# Patient Record
Sex: Male | Born: 1985 | Race: White | Hispanic: No | Marital: Single | Smoking: Never smoker
Health system: Southern US, Community
[De-identification: ages and names within clinical notes are randomized; demographics above are authoritative.]

## PROBLEM LIST (undated history)

## (undated) DIAGNOSIS — K219 Gastro-esophageal reflux disease without esophagitis: Secondary | ICD-10-CM

## (undated) DIAGNOSIS — E785 Hyperlipidemia, unspecified: Secondary | ICD-10-CM

## (undated) DIAGNOSIS — T7840XA Allergy, unspecified, initial encounter: Secondary | ICD-10-CM

## (undated) HISTORY — DX: Hyperlipidemia, unspecified: E78.5

## (undated) HISTORY — DX: Allergy, unspecified, initial encounter: T78.40XA

## (undated) HISTORY — DX: Gastro-esophageal reflux disease without esophagitis: K21.9

## (undated) HISTORY — PX: TYMPANOSTOMY TUBE PLACEMENT: SHX32

---

## 2007-09-28 ENCOUNTER — Emergency Department: Payer: Self-pay | Admitting: Internal Medicine

## 2019-06-09 DIAGNOSIS — M7540 Impingement syndrome of unspecified shoulder: Secondary | ICD-10-CM | POA: Insufficient documentation

## 2019-06-09 DIAGNOSIS — M25811 Other specified joint disorders, right shoulder: Secondary | ICD-10-CM | POA: Insufficient documentation

## 2020-12-25 DIAGNOSIS — M9903 Segmental and somatic dysfunction of lumbar region: Secondary | ICD-10-CM | POA: Diagnosis not present

## 2020-12-25 DIAGNOSIS — M62830 Muscle spasm of back: Secondary | ICD-10-CM | POA: Diagnosis not present

## 2020-12-25 DIAGNOSIS — M9905 Segmental and somatic dysfunction of pelvic region: Secondary | ICD-10-CM | POA: Diagnosis not present

## 2020-12-25 DIAGNOSIS — M955 Acquired deformity of pelvis: Secondary | ICD-10-CM | POA: Diagnosis not present

## 2020-12-27 DIAGNOSIS — M62830 Muscle spasm of back: Secondary | ICD-10-CM | POA: Diagnosis not present

## 2020-12-27 DIAGNOSIS — M955 Acquired deformity of pelvis: Secondary | ICD-10-CM | POA: Diagnosis not present

## 2020-12-27 DIAGNOSIS — M9905 Segmental and somatic dysfunction of pelvic region: Secondary | ICD-10-CM | POA: Diagnosis not present

## 2020-12-27 DIAGNOSIS — M9903 Segmental and somatic dysfunction of lumbar region: Secondary | ICD-10-CM | POA: Diagnosis not present

## 2021-01-09 DIAGNOSIS — M9905 Segmental and somatic dysfunction of pelvic region: Secondary | ICD-10-CM | POA: Diagnosis not present

## 2021-01-09 DIAGNOSIS — M9903 Segmental and somatic dysfunction of lumbar region: Secondary | ICD-10-CM | POA: Diagnosis not present

## 2021-01-09 DIAGNOSIS — M955 Acquired deformity of pelvis: Secondary | ICD-10-CM | POA: Diagnosis not present

## 2021-01-09 DIAGNOSIS — M62830 Muscle spasm of back: Secondary | ICD-10-CM | POA: Diagnosis not present

## 2021-04-24 DIAGNOSIS — M9905 Segmental and somatic dysfunction of pelvic region: Secondary | ICD-10-CM | POA: Diagnosis not present

## 2021-04-24 DIAGNOSIS — M9903 Segmental and somatic dysfunction of lumbar region: Secondary | ICD-10-CM | POA: Diagnosis not present

## 2021-04-24 DIAGNOSIS — M62830 Muscle spasm of back: Secondary | ICD-10-CM | POA: Diagnosis not present

## 2021-04-24 DIAGNOSIS — M955 Acquired deformity of pelvis: Secondary | ICD-10-CM | POA: Diagnosis not present

## 2021-04-30 DIAGNOSIS — M9905 Segmental and somatic dysfunction of pelvic region: Secondary | ICD-10-CM | POA: Diagnosis not present

## 2021-04-30 DIAGNOSIS — M955 Acquired deformity of pelvis: Secondary | ICD-10-CM | POA: Diagnosis not present

## 2021-04-30 DIAGNOSIS — M9903 Segmental and somatic dysfunction of lumbar region: Secondary | ICD-10-CM | POA: Diagnosis not present

## 2021-04-30 DIAGNOSIS — M62830 Muscle spasm of back: Secondary | ICD-10-CM | POA: Diagnosis not present

## 2021-05-07 DIAGNOSIS — M955 Acquired deformity of pelvis: Secondary | ICD-10-CM | POA: Diagnosis not present

## 2021-05-07 DIAGNOSIS — M9903 Segmental and somatic dysfunction of lumbar region: Secondary | ICD-10-CM | POA: Diagnosis not present

## 2021-05-07 DIAGNOSIS — M9905 Segmental and somatic dysfunction of pelvic region: Secondary | ICD-10-CM | POA: Diagnosis not present

## 2021-05-07 DIAGNOSIS — M62830 Muscle spasm of back: Secondary | ICD-10-CM | POA: Diagnosis not present

## 2021-05-16 DIAGNOSIS — M955 Acquired deformity of pelvis: Secondary | ICD-10-CM | POA: Diagnosis not present

## 2021-05-16 DIAGNOSIS — M62830 Muscle spasm of back: Secondary | ICD-10-CM | POA: Diagnosis not present

## 2021-05-16 DIAGNOSIS — M9903 Segmental and somatic dysfunction of lumbar region: Secondary | ICD-10-CM | POA: Diagnosis not present

## 2021-05-16 DIAGNOSIS — M9905 Segmental and somatic dysfunction of pelvic region: Secondary | ICD-10-CM | POA: Diagnosis not present

## 2021-05-21 DIAGNOSIS — M9905 Segmental and somatic dysfunction of pelvic region: Secondary | ICD-10-CM | POA: Diagnosis not present

## 2021-05-21 DIAGNOSIS — M955 Acquired deformity of pelvis: Secondary | ICD-10-CM | POA: Diagnosis not present

## 2021-05-21 DIAGNOSIS — M62830 Muscle spasm of back: Secondary | ICD-10-CM | POA: Diagnosis not present

## 2021-05-21 DIAGNOSIS — M9903 Segmental and somatic dysfunction of lumbar region: Secondary | ICD-10-CM | POA: Diagnosis not present

## 2021-05-22 DIAGNOSIS — M955 Acquired deformity of pelvis: Secondary | ICD-10-CM | POA: Diagnosis not present

## 2021-05-22 DIAGNOSIS — M9905 Segmental and somatic dysfunction of pelvic region: Secondary | ICD-10-CM | POA: Diagnosis not present

## 2021-05-22 DIAGNOSIS — M9903 Segmental and somatic dysfunction of lumbar region: Secondary | ICD-10-CM | POA: Diagnosis not present

## 2021-05-22 DIAGNOSIS — M62830 Muscle spasm of back: Secondary | ICD-10-CM | POA: Diagnosis not present

## 2021-05-23 DIAGNOSIS — M62830 Muscle spasm of back: Secondary | ICD-10-CM | POA: Diagnosis not present

## 2021-05-23 DIAGNOSIS — M9905 Segmental and somatic dysfunction of pelvic region: Secondary | ICD-10-CM | POA: Diagnosis not present

## 2021-05-23 DIAGNOSIS — M955 Acquired deformity of pelvis: Secondary | ICD-10-CM | POA: Diagnosis not present

## 2021-05-23 DIAGNOSIS — M9903 Segmental and somatic dysfunction of lumbar region: Secondary | ICD-10-CM | POA: Diagnosis not present

## 2021-05-27 DIAGNOSIS — M62830 Muscle spasm of back: Secondary | ICD-10-CM | POA: Diagnosis not present

## 2021-05-27 DIAGNOSIS — M9903 Segmental and somatic dysfunction of lumbar region: Secondary | ICD-10-CM | POA: Diagnosis not present

## 2021-05-27 DIAGNOSIS — M955 Acquired deformity of pelvis: Secondary | ICD-10-CM | POA: Diagnosis not present

## 2021-05-27 DIAGNOSIS — M9905 Segmental and somatic dysfunction of pelvic region: Secondary | ICD-10-CM | POA: Diagnosis not present

## 2021-09-06 DIAGNOSIS — M62830 Muscle spasm of back: Secondary | ICD-10-CM | POA: Diagnosis not present

## 2021-09-06 DIAGNOSIS — M9903 Segmental and somatic dysfunction of lumbar region: Secondary | ICD-10-CM | POA: Diagnosis not present

## 2021-09-06 DIAGNOSIS — M9905 Segmental and somatic dysfunction of pelvic region: Secondary | ICD-10-CM | POA: Diagnosis not present

## 2021-09-06 DIAGNOSIS — M955 Acquired deformity of pelvis: Secondary | ICD-10-CM | POA: Diagnosis not present

## 2021-09-13 DIAGNOSIS — M62830 Muscle spasm of back: Secondary | ICD-10-CM | POA: Diagnosis not present

## 2021-09-13 DIAGNOSIS — M955 Acquired deformity of pelvis: Secondary | ICD-10-CM | POA: Diagnosis not present

## 2021-09-13 DIAGNOSIS — M9903 Segmental and somatic dysfunction of lumbar region: Secondary | ICD-10-CM | POA: Diagnosis not present

## 2021-09-13 DIAGNOSIS — M9905 Segmental and somatic dysfunction of pelvic region: Secondary | ICD-10-CM | POA: Diagnosis not present

## 2021-09-26 DIAGNOSIS — M9905 Segmental and somatic dysfunction of pelvic region: Secondary | ICD-10-CM | POA: Diagnosis not present

## 2021-09-26 DIAGNOSIS — M9903 Segmental and somatic dysfunction of lumbar region: Secondary | ICD-10-CM | POA: Diagnosis not present

## 2021-09-26 DIAGNOSIS — M62830 Muscle spasm of back: Secondary | ICD-10-CM | POA: Diagnosis not present

## 2021-09-26 DIAGNOSIS — M955 Acquired deformity of pelvis: Secondary | ICD-10-CM | POA: Diagnosis not present

## 2021-10-31 DIAGNOSIS — M9903 Segmental and somatic dysfunction of lumbar region: Secondary | ICD-10-CM | POA: Diagnosis not present

## 2021-10-31 DIAGNOSIS — M955 Acquired deformity of pelvis: Secondary | ICD-10-CM | POA: Diagnosis not present

## 2021-10-31 DIAGNOSIS — M62830 Muscle spasm of back: Secondary | ICD-10-CM | POA: Diagnosis not present

## 2021-10-31 DIAGNOSIS — M9905 Segmental and somatic dysfunction of pelvic region: Secondary | ICD-10-CM | POA: Diagnosis not present

## 2022-01-10 DIAGNOSIS — M62830 Muscle spasm of back: Secondary | ICD-10-CM | POA: Diagnosis not present

## 2022-01-10 DIAGNOSIS — M9905 Segmental and somatic dysfunction of pelvic region: Secondary | ICD-10-CM | POA: Diagnosis not present

## 2022-01-10 DIAGNOSIS — M9903 Segmental and somatic dysfunction of lumbar region: Secondary | ICD-10-CM | POA: Diagnosis not present

## 2022-01-10 DIAGNOSIS — M955 Acquired deformity of pelvis: Secondary | ICD-10-CM | POA: Diagnosis not present

## 2022-02-11 DIAGNOSIS — M955 Acquired deformity of pelvis: Secondary | ICD-10-CM | POA: Diagnosis not present

## 2022-02-11 DIAGNOSIS — M9905 Segmental and somatic dysfunction of pelvic region: Secondary | ICD-10-CM | POA: Diagnosis not present

## 2022-02-11 DIAGNOSIS — M62830 Muscle spasm of back: Secondary | ICD-10-CM | POA: Diagnosis not present

## 2022-02-11 DIAGNOSIS — M9903 Segmental and somatic dysfunction of lumbar region: Secondary | ICD-10-CM | POA: Diagnosis not present

## 2022-02-12 DIAGNOSIS — M62830 Muscle spasm of back: Secondary | ICD-10-CM | POA: Diagnosis not present

## 2022-02-12 DIAGNOSIS — M9903 Segmental and somatic dysfunction of lumbar region: Secondary | ICD-10-CM | POA: Diagnosis not present

## 2022-02-12 DIAGNOSIS — M9905 Segmental and somatic dysfunction of pelvic region: Secondary | ICD-10-CM | POA: Diagnosis not present

## 2022-02-12 DIAGNOSIS — M955 Acquired deformity of pelvis: Secondary | ICD-10-CM | POA: Diagnosis not present

## 2022-02-13 ENCOUNTER — Encounter: Payer: Self-pay | Admitting: Family Medicine

## 2022-02-13 ENCOUNTER — Ambulatory Visit (INDEPENDENT_AMBULATORY_CARE_PROVIDER_SITE_OTHER): Payer: BC Managed Care – PPO | Admitting: Family Medicine

## 2022-02-13 ENCOUNTER — Ambulatory Visit
Admission: RE | Admit: 2022-02-13 | Discharge: 2022-02-13 | Disposition: A | Discharge status: Home / Self Care | Payer: BC Managed Care – PPO | Source: Ambulatory Visit | Attending: Family Medicine | Admitting: Family Medicine

## 2022-02-13 ENCOUNTER — Other Ambulatory Visit: Payer: Self-pay

## 2022-02-13 ENCOUNTER — Ambulatory Visit
Admission: RE | Admit: 2022-02-13 | Discharge: 2022-02-13 | Disposition: A | Discharge status: Home / Self Care | Payer: BC Managed Care – PPO | Attending: Family Medicine | Admitting: Family Medicine

## 2022-02-13 VITALS — BP 124/92 | HR 78 | Ht 66.0 in | Wt 228.0 lb

## 2022-02-13 DIAGNOSIS — M222X1 Patellofemoral disorders, right knee: Secondary | ICD-10-CM

## 2022-02-13 DIAGNOSIS — G8929 Other chronic pain: Secondary | ICD-10-CM | POA: Insufficient documentation

## 2022-02-13 DIAGNOSIS — M545 Low back pain, unspecified: Secondary | ICD-10-CM

## 2022-02-13 DIAGNOSIS — M533 Sacrococcygeal disorders, not elsewhere classified: Secondary | ICD-10-CM | POA: Diagnosis not present

## 2022-02-13 DIAGNOSIS — M222X2 Patellofemoral disorders, left knee: Secondary | ICD-10-CM

## 2022-02-13 DIAGNOSIS — M25562 Pain in left knee: Secondary | ICD-10-CM | POA: Diagnosis not present

## 2022-02-13 DIAGNOSIS — M25561 Pain in right knee: Secondary | ICD-10-CM | POA: Diagnosis not present

## 2022-02-13 DIAGNOSIS — Z23 Encounter for immunization: Secondary | ICD-10-CM | POA: Diagnosis not present

## 2022-02-13 DIAGNOSIS — M4727 Other spondylosis with radiculopathy, lumbosacral region: Secondary | ICD-10-CM | POA: Insufficient documentation

## 2022-02-13 MED ORDER — MELOXICAM 15 MG PO TABS: 15.0000 mg | ORAL_TABLET | Freq: Every day | ORAL | 0 refills | Status: DC

## 2022-02-13 NOTE — Patient Instructions (Signed)
-   Obtain x-rays - Start meloxicam, dose daily with food x2 weeks, after 2 weeks dose once daily on as-needed basis for any low back/knee pain - Can look into "patellofemoral syndrome "and "sacroiliitis/lumbar spondylosis " - Return for follow-up in 4 weeks

## 2022-02-13 NOTE — Progress Notes (Signed)
°  ° °  Primary Care / Sports Medicine Office Visit  Patient Information:  Patient ID: Sean Freeman, male DOB: 1986/10/19 Age: 36 y.o. MRN: [id number]   Sean Freeman is a pleasant 36 y.o. male presenting with the following:  Chief Complaint  Patient presents with   New Patient (Initial Visit)   Establish Care   Back Pain   Knee Pain    Vitals:   02/13/22 1046  BP: (!) 124/92  Pulse: 78  SpO2: 99%   Vitals:   02/13/22 1046  Weight: 228 lb (103.4 kg)  Height: 5\' 6"  (1.676 m)   Body mass index is 36.8 kg/m.  No results found.   Independent interpretation of notes and tests performed by another provider:   None  Procedures performed:   None  Pertinent History, Exam, Impression, and Recommendations:   Patellofemoral syndrome, bilateral Longstanding bilateral anterior knee pain, ongoing for roughly 5 years, atraumatic in onset.  Associated with clicking and popping, denies any mechanical symptoms, no instability, pain with deep bend/squat, difficulty arising from this position.  Denies any treatments to date.  Examination reveals dynamic maltracking with passive flexion/extension, tenderness at the superior and inferior left patella, mild tenderness of the right inferior patella, bilaterally tenderness at the medial joint lines, no laxity with valgus/varus or anterior/posterior stressing, negative medial and lateral McMurray's, single-leg squat with excessive valgus.  History and physical most consistent with patellofemoral syndrome bilaterally, treatment at this stage involving scheduled meloxicam x2 weeks then as needed, dedicated x-rays, and close follow-up.  Once symptoms allow, formal PT versus home-based PT to commence.  Chronic condition, Rx management  Chronic low back pain without sciatica Patient with longstanding (greater than 10 years) low back pain without radiation in the setting of multiple sports/athletics and varying injuries.  He denies any weakness or  paresthesias, intermittently left greater than right and vice versa pain localized about the sacrum, aggravated by bending, twisting, prolonged weightbearing.  This is episodic in nature and most recent episode was earlier this week.  He has been utilizing OTC ibuprofen as needed and chiropractic care.  Denies any bowel/bladder issues.  Physical exam reveals negative straight leg raise bilaterally, sensorimotor intact bilateral lower extremities, hamstring tightness bilaterally but otherwise negative FABER, FADIR, equivocal piriformis stretching bilaterally, focal tenderness at the left greater than right SI joint.  Findings are most consistent with focal SI joint pain in the setting of chronic lumbar pathology, dedicated x-rays ordered, initiation of meloxicam, and close follow-up advised.  Chronic condition, symptomatic, Rx management   Need for tetanus, diphtheria, and acellular pertussis (Tdap) vaccine Tdap administered today  SI (sacroiliac) pain Left greater than right, chronic in nature, see additional assessment(s) for plan details.   Orders & Medications Meds ordered this encounter  Medications   meloxicam (MOBIC) 15 MG tablet    Sig: Take 1 tablet (15 mg total) by mouth daily.    Dispense:  30 tablet    Refill:  0   Orders Placed This Encounter  Procedures   DG Lumbar Spine Complete   DG Knee Complete 4 Views Right   DG Knee Complete 4 Views Left   Tdap vaccine greater than or equal to 36yo IM     Return in about 4 weeks (around 03/13/2022) for Annual physical and follow-up.     03/15/2022, MD   Primary Care Sports Medicine [facility]

## 2022-02-13 NOTE — Assessment & Plan Note (Signed)
Longstanding bilateral anterior knee pain, ongoing for roughly 5 years, atraumatic in onset.  Associated with clicking and popping, denies any mechanical symptoms, no instability, pain with deep bend/squat, difficulty arising from this position.  Denies any treatments to date.  Examination reveals dynamic maltracking with passive flexion/extension, tenderness at the superior and inferior left patella, mild tenderness of the right inferior patella, bilaterally tenderness at the medial joint lines, no laxity with valgus/varus or anterior/posterior stressing, negative medial and lateral McMurray's, single-leg squat with excessive valgus.  History and physical most consistent with patellofemoral syndrome bilaterally, treatment at this stage involving scheduled meloxicam x2 weeks then as needed, dedicated x-rays, and close follow-up.  Once symptoms allow, formal PT versus home-based PT to commence.  Chronic condition, Rx management

## 2022-02-13 NOTE — Assessment & Plan Note (Signed)
Tdap administered today. 

## 2022-02-13 NOTE — Progress Notes (Signed)
°  ° °  Primary Care / Sports Medicine Office Visit  Patient Information:  Patient ID: Sean Freeman, male DOB: [birth year] Age: 36 y.o. MRN: [id number]   Sean Freeman is a pleasant 36 y.o. male presenting with the following:  Chief Complaint  Patient presents with   New Patient (Initial Visit)   Establish Care   Back Pain   Knee Pain    Vitals:   02/13/22 1046  BP: (!) 124/92  Pulse: 78  SpO2: 99%   Vitals:   02/13/22 1046  Weight: 228 lb (103.4 kg)  Height: 5\' 6"  (1.676 m)   Body mass index is 36.8 kg/m.  No results found.   Independent interpretation of notes and tests performed by another provider:   None  Procedures performed:   None  Pertinent History, Exam, Impression, and Recommendations:   Patellofemoral syndrome, bilateral Longstanding bilateral anterior knee pain, ongoing for roughly 5 years, atraumatic in onset.  Associated with clicking and popping, denies any mechanical symptoms, no instability, pain with deep bend/squat, difficulty arising from this position.  Denies any treatments to date.  Examination reveals dynamic maltracking with passive flexion/extension, tenderness at the superior and inferior left patella, mild tenderness of the right inferior patella, bilaterally tenderness at the medial joint lines, no laxity with valgus/varus or anterior/posterior stressing, negative medial and lateral McMurray's, single-leg squat with excessive valgus.  History and physical most consistent with patellofemoral syndrome bilaterally, treatment at this stage involving scheduled meloxicam x2 weeks then as needed, dedicated x-rays, and close follow-up.  Once symptoms allow, formal PT versus home-based PT to commence.  Chronic condition, Rx management  Chronic low back pain without sciatica Patient with longstanding (greater than 10 years) low back pain without radiation in the setting of multiple sports/athletics and varying injuries.  He denies any weakness or  paresthesias, intermittently left greater than right and vice versa pain localized about the sacrum, aggravated by bending, twisting, prolonged weightbearing.  This is episodic in nature and most recent episode was earlier this week.  He has been utilizing OTC ibuprofen as needed and chiropractic care.  Denies any bowel/bladder issues.  Physical exam reveals negative straight leg raise bilaterally, sensorimotor intact bilateral lower extremities, hamstring tightness bilaterally but otherwise negative FABER, FADIR, equivocal piriformis stretching bilaterally, focal tenderness at the left greater than right SI joint.  Findings are most consistent with focal SI joint pain in the setting of chronic lumbar pathology, dedicated x-rays ordered, initiation of meloxicam, and close follow-up advised.  Chronic condition, symptomatic, Rx management    Orders & Medications Meds ordered this encounter  Medications   meloxicam (MOBIC) 15 MG tablet    Sig: Take 1 tablet (15 mg total) by mouth daily.    Dispense:  30 tablet    Refill:  0   Orders Placed This Encounter  Procedures   DG Lumbar Spine Complete   DG Knee Complete 4 Views Right   DG Knee Complete 4 Views Left   Tdap vaccine greater than or equal to 7yo IM     Return in about 4 weeks (around 03/13/2022) for Annual physical and follow-up.     03/15/2022, MD   Primary Care Sports Medicine [facility]

## 2022-02-13 NOTE — Assessment & Plan Note (Signed)
Patient with longstanding (greater than 10 years) low back pain without radiation in the setting of multiple sports/athletics and varying injuries.  He denies any weakness or paresthesias, intermittently left greater than right and vice versa pain localized about the sacrum, aggravated by bending, twisting, prolonged weightbearing.  This is episodic in nature and most recent episode was earlier this week.  He has been utilizing OTC ibuprofen as needed and chiropractic care.  Denies any bowel/bladder issues.  Physical exam reveals negative straight leg raise bilaterally, sensorimotor intact bilateral lower extremities, hamstring tightness bilaterally but otherwise negative FABER, FADIR, equivocal piriformis stretching bilaterally, focal tenderness at the left greater than right SI joint.  Findings are most consistent with focal SI joint pain in the setting of chronic lumbar pathology, dedicated x-rays ordered, initiation of meloxicam, and close follow-up advised.  Chronic condition, symptomatic, Rx management

## 2022-02-13 NOTE — Assessment & Plan Note (Signed)
Left greater than right, chronic in nature, see additional assessment(s) for plan details.

## 2022-02-13 NOTE — Assessment & Plan Note (Signed)
>>  ASSESSMENT AND PLAN FOR CHRONIC LOW BACK PAIN WITHOUT SCIATICA WRITTEN ON 02/13/2022  5:43 PM BY Brityn Mastrogiovanni, Ocie Bob, MD  Patient with longstanding (greater than 10 years) low back pain without radiation in the setting of multiple sports/athletics and varying injuries.  He denies any weakness or paresthesias, intermittently left greater than right and vice versa pain localized about the sacrum, aggravated by bending, twisting, prolonged weightbearing.  This is episodic in nature and most recent episode was earlier this week.  He has been utilizing OTC ibuprofen as needed and chiropractic care.  Denies any bowel/bladder issues.  Physical exam reveals negative straight leg raise bilaterally, sensorimotor intact bilateral lower extremities, hamstring tightness bilaterally but otherwise negative FABER, FADIR, equivocal piriformis stretching bilaterally, focal tenderness at the left greater than right SI joint.  Findings are most consistent with focal SI joint pain in the setting of chronic lumbar pathology, dedicated x-rays ordered, initiation of meloxicam, and close follow-up advised.  Chronic condition, symptomatic, Rx management

## 2022-02-14 DIAGNOSIS — M9905 Segmental and somatic dysfunction of pelvic region: Secondary | ICD-10-CM | POA: Diagnosis not present

## 2022-02-14 DIAGNOSIS — M9903 Segmental and somatic dysfunction of lumbar region: Secondary | ICD-10-CM | POA: Diagnosis not present

## 2022-02-14 DIAGNOSIS — M62830 Muscle spasm of back: Secondary | ICD-10-CM | POA: Diagnosis not present

## 2022-02-14 DIAGNOSIS — M955 Acquired deformity of pelvis: Secondary | ICD-10-CM | POA: Diagnosis not present

## 2022-02-28 DIAGNOSIS — M9903 Segmental and somatic dysfunction of lumbar region: Secondary | ICD-10-CM | POA: Diagnosis not present

## 2022-02-28 DIAGNOSIS — M62830 Muscle spasm of back: Secondary | ICD-10-CM | POA: Diagnosis not present

## 2022-02-28 DIAGNOSIS — M9905 Segmental and somatic dysfunction of pelvic region: Secondary | ICD-10-CM | POA: Diagnosis not present

## 2022-02-28 DIAGNOSIS — M955 Acquired deformity of pelvis: Secondary | ICD-10-CM | POA: Diagnosis not present

## 2022-03-13 ENCOUNTER — Encounter: Payer: BC Managed Care – PPO | Admitting: Family Medicine

## 2022-03-14 ENCOUNTER — Encounter: Payer: Self-pay | Admitting: Family Medicine

## 2022-03-14 ENCOUNTER — Ambulatory Visit (INDEPENDENT_AMBULATORY_CARE_PROVIDER_SITE_OTHER): Payer: BC Managed Care – PPO | Admitting: Family Medicine

## 2022-03-14 ENCOUNTER — Other Ambulatory Visit: Payer: Self-pay

## 2022-03-14 VITALS — BP 138/88 | HR 90 | Ht 66.0 in | Wt 231.0 lb

## 2022-03-14 DIAGNOSIS — M222X1 Patellofemoral disorders, right knee: Secondary | ICD-10-CM | POA: Diagnosis not present

## 2022-03-14 DIAGNOSIS — G8929 Other chronic pain: Secondary | ICD-10-CM

## 2022-03-14 DIAGNOSIS — M545 Low back pain, unspecified: Secondary | ICD-10-CM | POA: Diagnosis not present

## 2022-03-14 DIAGNOSIS — R7989 Other specified abnormal findings of blood chemistry: Secondary | ICD-10-CM | POA: Diagnosis not present

## 2022-03-14 DIAGNOSIS — M62830 Muscle spasm of back: Secondary | ICD-10-CM | POA: Diagnosis not present

## 2022-03-14 DIAGNOSIS — Z114 Encounter for screening for human immunodeficiency virus [HIV]: Secondary | ICD-10-CM | POA: Diagnosis not present

## 2022-03-14 DIAGNOSIS — K219 Gastro-esophageal reflux disease without esophagitis: Secondary | ICD-10-CM | POA: Diagnosis not present

## 2022-03-14 DIAGNOSIS — M9903 Segmental and somatic dysfunction of lumbar region: Secondary | ICD-10-CM | POA: Diagnosis not present

## 2022-03-14 DIAGNOSIS — Z13220 Encounter for screening for lipoid disorders: Secondary | ICD-10-CM

## 2022-03-14 DIAGNOSIS — Z0000 Encounter for general adult medical examination without abnormal findings: Secondary | ICD-10-CM

## 2022-03-14 DIAGNOSIS — M533 Sacrococcygeal disorders, not elsewhere classified: Secondary | ICD-10-CM | POA: Diagnosis not present

## 2022-03-14 DIAGNOSIS — M955 Acquired deformity of pelvis: Secondary | ICD-10-CM | POA: Diagnosis not present

## 2022-03-14 DIAGNOSIS — Z1159 Encounter for screening for other viral diseases: Secondary | ICD-10-CM | POA: Diagnosis not present

## 2022-03-14 DIAGNOSIS — M222X2 Patellofemoral disorders, left knee: Secondary | ICD-10-CM

## 2022-03-14 DIAGNOSIS — M9905 Segmental and somatic dysfunction of pelvic region: Secondary | ICD-10-CM | POA: Diagnosis not present

## 2022-03-14 MED ORDER — MELOXICAM 15 MG PO TABS: 15.0000 mg | ORAL_TABLET | Freq: Every day | ORAL | 1 refills | Status: DC | PRN

## 2022-03-14 MED ORDER — PANTOPRAZOLE SODIUM 40 MG PO TBEC: 40.0000 mg | DELAYED_RELEASE_TABLET | Freq: Every day | ORAL | 3 refills | Status: DC

## 2022-03-14 NOTE — Assessment & Plan Note (Signed)
This is a chronic condition that had previously been well controlled with OTC PPI omeprazole, since initiating meloxicam he has noted progressive worsening of his symptoms despite continued PPI dosing.  I did discuss the nature of NSAIDs in regards to GI symptoms and overall plan to limit exposure to NSAIDs.  While he is working on knee/back pain with home-based rehab to limit the need for NSAIDs, short course of Protonix prescribed to utilize in place of his omeprazole. ? ?Chronic condition, uncontrolled, Rx management ?

## 2022-03-14 NOTE — Patient Instructions (Signed)
-   Obtain fasting labs with orders provided (can have water or black coffee but otherwise no food or drink x 8 hours before labs) - Review information provided - Attend eye doctor annually, dentist every 6 months, work towards or maintain 30 minutes of moderate intensity physical activity at least 5 days per week, and consume a balanced diet - Return in 3 months - Contact us for any questions between now and then 

## 2022-03-14 NOTE — Assessment & Plan Note (Signed)
Annual examination completed, risk stratification labs ordered, anticipatory guidance provided.  We will follow labs once resulted. 

## 2022-03-14 NOTE — Assessment & Plan Note (Signed)
Patient presents for follow-up to chronic low back pain, ongoing for greater than 10 years, and at his last visit on 02/13/2022 concern was for focal SI joint pain in the setting of chronic lumbar pathology, plan for x-rays, meloxicam, and follow-up. ? ?He states that he has noted excellent response following meloxicam, though worsened chronic GI symptoms that have previously been well controlled with OTC PPIs.  Reviewed the x-rays with the patient which do show issues with alignment and focal intervertebral involvement at the L5-S1 level. ? ?Treatment strategies were reviewed and he is amenable to a transition to as needed meloxicam, home-based rehab program, and follow-up in 3 months. ? ?Chronic condition, stable, Rx management ?

## 2022-03-14 NOTE — Assessment & Plan Note (Signed)
>>  ASSESSMENT AND PLAN FOR CHRONIC LOW BACK PAIN WITHOUT SCIATICA WRITTEN ON 03/14/2022 12:22 PM BY Kallin Henk, Ocie Bob, MD  Patient presents for follow-up to chronic low back pain, ongoing for greater than 10 years, and at his last visit on 02/13/2022 concern was for focal SI joint pain in the setting of chronic lumbar pathology, plan for x-rays, meloxicam, and follow-up.  He states that he has noted excellent response following meloxicam, though worsened chronic GI symptoms that have previously been well controlled with OTC PPIs.  Reviewed the x-rays with the patient which do show issues with alignment and focal intervertebral involvement at the L5-S1 level.  Treatment strategies were reviewed and he is amenable to a transition to as needed meloxicam, home-based rehab program, and follow-up in 3 months.  Chronic condition, stable, Rx management

## 2022-03-14 NOTE — Progress Notes (Signed)
?  ? ?Annual Physical Exam Visit ? ?Patient Information:  ?Patient ID: Sean Freeman, male DOB: 11-07-1986 Age: 36 y.o. MRN: [id number]  ? ?Subjective:  ? ?CC: Annual Physical Exam ? ?HPI:  ?Sean Freeman is here for their annual physical. ? ?I reviewed the past medical history, family history, social history, surgical history, and allergies today and changes were made as necessary.  Please see the problem list section below for additional details. ? ?Past Medical History: ?Past Medical History:  ?Diagnosis Date  ? Allergy   ? GERD (gastroesophageal reflux disease)   ? ?Past Surgical History: ?Past Surgical History:  ?Procedure Laterality Date  ? TYMPANOSTOMY TUBE PLACEMENT Bilateral   ? ?Family History: ?Family History  ?Problem Relation Age of Onset  ? Diabetes Mother   ? Allergies Brother   ? Diabetes Paternal Uncle   ? Heart attack Paternal Uncle   ? Diabetes Paternal Uncle   ? Heart attack Paternal Uncle   ? Diabetes Paternal Uncle   ? Heart attack Paternal Uncle   ? Diabetes Maternal Grandmother   ? Heart attack Maternal Grandmother   ? Diabetes Maternal Grandfather   ? Heart attack Maternal Grandfather   ? ?Allergies: ?Allergies  ?Allergen Reactions  ? Amoxicillin Hives  ? Penicillins Hives  ? ?Health Maintenance: ?Health Maintenance  ?Topic Date Due  ? Hepatitis C Screening  Never done  ? INFLUENZA VACCINE  03/21/2022 (Originally 07/22/2021)  ? COVID-19 Vaccine (1) 04/20/2022 (Originally [birth year]1986)  ? TETANUS/TDAP  02/14/2032  ? HIV Screening  Completed  ? HPV VACCINES  Aged Out  ?  ?HM Colonoscopy   ? ? This patient has no relevant Health Maintenance data.  ? ?  ? ?Medications: ?No current outpatient medications on file prior to visit.  ? ?No current facility-administered medications on file prior to visit.  ? ? ?Review of Systems: No headache, visual changes, nausea, vomiting, diarrhea, constipation, dizziness, +abdominal pain, skin rash, fevers, chills, night sweats, swollen lymph nodes, weight loss, chest  pain, body aches, joint swelling, muscle aches, shortness of breath, mood changes, visual or auditory hallucinations reported. ? ?Objective:  ? ?Vitals:  ? 03/14/22 0847  ?BP: 138/88  ?Pulse: 90  ?SpO2: 98%  ? ?Vitals:  ? 03/14/22 0847  ?Weight: 231 lb (104.8 kg)  ?Height: 5\' 6"  (1.676 m)  ? ?Body mass index is 37.28 kg/m?. ? ?General: Well Developed, well nourished, and in no acute distress.  ?Neuro: Alert and oriented x3, extra-ocular muscles intact, sensation grossly intact. Cranial nerves II through XII are grossly intact, motor, sensory, and coordinative functions are intact. ?HEENT: Normocephalic, atraumatic, pupils equal round reactive to light, neck supple, no masses, no lymphadenopathy, thyroid nonpalpable. Oropharynx, nasopharynx, external ear canals are unremarkable. ?Skin: Warm and dry, no rashes noted.  ?Cardiac: Regular rate and rhythm, no murmurs rubs or gallops. No peripheral edema. Pulses symmetric. ?Respiratory: Clear to auscultation bilaterally. Not using accessory muscles, speaking in full sentences.  ?Abdominal: Soft, nontender, nondistended, positive bowel sounds, no masses, no organomegaly. ?Musculoskeletal: Shoulder, elbow, wrist, hip, knee, ankle stable, and with full range of motion. ? ?Impression and Recommendations:  ? ?The patient was counselled, risk factors were discussed, and anticipatory guidance given. ? ?Patellofemoral syndrome, bilateral ?Patient presents for follow-up to chronic bilateral anterior knee pain, right greater than left, onset 2018.  At last visit on 02/13/2022, concern for patellofemoral syndrome, x-rays ordered, scheduled meloxicam advised. ? ?He states that he has noted excellent pain control with the meloxicam however the knees  continue to click and pop, now without pain, having worsening of his chronic reflux which had previously been well controlled with OTC medications.  We reviewed x-rays there obtained over the interim visit which do reveal findings consistent  with chronic lateral patellar tilt and subtle degeneration at the lateral patellar facets bilaterally. ? ?Given his clinical progress, x-ray findings, response to therapy thus far, I reviewed additional treatment strategies.  He is amenable from a medication management standpoint to transition to as needed dosing of meloxicam, performing a home-based rehab program, and returning in 3 months for reevaluation. ? ?Chronic condition, stable, Rx management ? ?Chronic low back pain without sciatica ?Patient presents for follow-up to chronic low back pain, ongoing for greater than 10 years, and at his last visit on 02/13/2022 concern was for focal SI joint pain in the setting of chronic lumbar pathology, plan for x-rays, meloxicam, and follow-up. ? ?He states that he has noted excellent response following meloxicam, though worsened chronic GI symptoms that have previously been well controlled with OTC PPIs.  Reviewed the x-rays with the patient which do show issues with alignment and focal intervertebral involvement at the L5-S1 level. ? ?Treatment strategies were reviewed and he is amenable to a transition to as needed meloxicam, home-based rehab program, and follow-up in 3 months. ? ?Chronic condition, stable, Rx management ? ?Gastroesophageal reflux disease ?This is a chronic condition that had previously been well controlled with OTC PPI omeprazole, since initiating meloxicam he has noted progressive worsening of his symptoms despite continued PPI dosing.  I did discuss the nature of NSAIDs in regards to GI symptoms and overall plan to limit exposure to NSAIDs.  While he is working on knee/back pain with home-based rehab to limit the need for NSAIDs, short course of Protonix prescribed to utilize in place of his omeprazole. ? ?Chronic condition, uncontrolled, Rx management ? ?Annual physical exam ?Annual examination completed, risk stratification labs ordered, anticipatory guidance provided.  We will follow labs once  resulted. ? ?Orders & Medications ?Medications:  ?Meds ordered this encounter  ?Medications  ? pantoprazole (PROTONIX) 40 MG tablet  ?  Sig: Take 1 tablet (40 mg total) by mouth daily.  ?  Dispense:  30 tablet  ?  Refill:  3  ? meloxicam (MOBIC) 15 MG tablet  ?  Sig: Take 1 tablet (15 mg total) by mouth daily as needed for pain.  ?  Dispense:  30 tablet  ?  Refill:  1  ? ?Orders Placed This Encounter  ?Procedures  ? Apo A1 + B + Ratio  ? CBC  ? Comprehensive metabolic panel  ? Hepatitis C antibody  ? VITAMIN D 25 Hydroxy (Vit-D Deficiency, Fractures)  ? TSH  ? Lipid panel  ? HIV Antibody (routine testing w rflx)  ?  ? ?Return in about 3 months (around 06/14/2022).  ? ? ?Jerrol Banana, MD ? ? Primary Care Sports Medicine ?[facility] ?[facility]  ? ?

## 2022-03-14 NOTE — Assessment & Plan Note (Signed)
Patient presents for follow-up to chronic bilateral anterior knee pain, right greater than left, onset 2018.  At last visit on 02/13/2022, concern for patellofemoral syndrome, x-rays ordered, scheduled meloxicam advised. ? ?He states that he has noted excellent pain control with the meloxicam however the knees continue to click and pop, now without pain, having worsening of his chronic reflux which had previously been well controlled with OTC medications.  We reviewed x-rays there obtained over the interim visit which do reveal findings consistent with chronic lateral patellar tilt and subtle degeneration at the lateral patellar facets bilaterally. ? ?Given his clinical progress, x-ray findings, response to therapy thus far, I reviewed additional treatment strategies.  He is amenable from a medication management standpoint to transition to as needed dosing of meloxicam, performing a home-based rehab program, and returning in 3 months for reevaluation. ? ?Chronic condition, stable, Rx management ?

## 2022-03-17 DIAGNOSIS — Z114 Encounter for screening for human immunodeficiency virus [HIV]: Secondary | ICD-10-CM | POA: Diagnosis not present

## 2022-03-17 DIAGNOSIS — Z0000 Encounter for general adult medical examination without abnormal findings: Secondary | ICD-10-CM | POA: Diagnosis not present

## 2022-03-17 DIAGNOSIS — Z1159 Encounter for screening for other viral diseases: Secondary | ICD-10-CM | POA: Diagnosis not present

## 2022-03-17 DIAGNOSIS — Z13220 Encounter for screening for lipoid disorders: Secondary | ICD-10-CM | POA: Diagnosis not present

## 2022-03-17 DIAGNOSIS — R7989 Other specified abnormal findings of blood chemistry: Secondary | ICD-10-CM | POA: Diagnosis not present

## 2022-03-18 LAB — COMPREHENSIVE METABOLIC PANEL
ALT: 36 IU/L (ref 0–44)
AST: 30 IU/L (ref 0–40)
Albumin/Globulin Ratio: 1.8 (ref 1.2–2.2)
Albumin: 4.5 g/dL (ref 4.0–5.0)
Alkaline Phosphatase: 111 IU/L (ref 44–121)
BUN/Creatinine Ratio: 15 (ref 9–20)
BUN: 15 mg/dL (ref 6–20)
Bilirubin Total: 0.4 mg/dL (ref 0.0–1.2)
CO2: 16 mmol/L — ABNORMAL LOW (ref 20–29)
Calcium: 9.8 mg/dL (ref 8.7–10.2)
Chloride: 101 mmol/L (ref 96–106)
Creatinine, Ser: 1.02 mg/dL (ref 0.76–1.27)
Globulin, Total: 2.5 g/dL (ref 1.5–4.5)
Glucose: 98 mg/dL (ref 70–99)
Potassium: 4.3 mmol/L (ref 3.5–5.2)
Sodium: 136 mmol/L (ref 134–144)
Total Protein: 7 g/dL (ref 6.0–8.5)
eGFR: 98 mL/min/{1.73_m2} (ref >59)

## 2022-03-18 LAB — LIPID PANEL
Chol/HDL Ratio: 5.7 ratio — ABNORMAL HIGH (ref 0.0–5.0)
Cholesterol, Total: 187 mg/dL (ref 100–199)
HDL: 33 mg/dL — ABNORMAL LOW (ref >39)
LDL Chol Calc (NIH): 118 mg/dL — ABNORMAL HIGH (ref 0–99)
Triglycerides: 205 mg/dL — ABNORMAL HIGH (ref 0–149)
VLDL Cholesterol Cal: 36 mg/dL (ref 5–40)

## 2022-03-18 LAB — APO A1 + B + RATIO
Apolipo. B/A-1 Ratio: 1.1 ratio — ABNORMAL HIGH (ref 0.0–0.7)
Apolipoprotein A-1: 106 mg/dL (ref 101–178)
Apolipoprotein B: 118 mg/dL — ABNORMAL HIGH (ref <90)

## 2022-03-18 LAB — CBC
Hematocrit: 42.7 % (ref 37.5–51.0)
Hemoglobin: 14.5 g/dL (ref 13.0–17.7)
MCH: 27.8 pg (ref 26.6–33.0)
MCHC: 34 g/dL (ref 31.5–35.7)
MCV: 82 fL (ref 79–97)
Platelets: 239 10*3/uL (ref 150–450)
RBC: 5.21 x10E6/uL (ref 4.14–5.80)
RDW: 13.1 % (ref 11.6–15.4)
WBC: 5.6 10*3/uL (ref 3.4–10.8)

## 2022-03-18 LAB — HEPATITIS C ANTIBODY: Hep C Virus Ab: NONREACTIVE

## 2022-03-18 LAB — HIV ANTIBODY (ROUTINE TESTING W REFLEX): HIV Screen 4th Generation wRfx: NONREACTIVE

## 2022-03-18 LAB — VITAMIN D 25 HYDROXY (VIT D DEFICIENCY, FRACTURES): Vit D, 25-Hydroxy: 15.7 ng/mL — ABNORMAL LOW (ref 30.0–100.0)

## 2022-03-18 LAB — TSH: TSH: 3.49 u[IU]/mL (ref 0.450–4.500)

## 2022-03-19 ENCOUNTER — Other Ambulatory Visit: Payer: Self-pay | Admitting: Family Medicine

## 2022-03-19 DIAGNOSIS — E782 Mixed hyperlipidemia: Secondary | ICD-10-CM

## 2022-03-19 DIAGNOSIS — R7989 Other specified abnormal findings of blood chemistry: Secondary | ICD-10-CM

## 2022-03-19 MED ORDER — ROSUVASTATIN CALCIUM 5 MG PO TABS: 5.0000 mg | ORAL_TABLET | Freq: Every day | ORAL | 3 refills | Status: DC

## 2022-03-19 MED ORDER — VITAMIN D (ERGOCALCIFEROL) 1.25 MG (50000 UNIT) PO CAPS
50000.0000 [IU] | ORAL_CAPSULE | ORAL | 0 refills | Status: DC
Start: 2022-03-19 — End: 2022-06-05

## 2022-03-20 DIAGNOSIS — M955 Acquired deformity of pelvis: Secondary | ICD-10-CM | POA: Diagnosis not present

## 2022-03-20 DIAGNOSIS — M9903 Segmental and somatic dysfunction of lumbar region: Secondary | ICD-10-CM | POA: Diagnosis not present

## 2022-03-20 DIAGNOSIS — M62830 Muscle spasm of back: Secondary | ICD-10-CM | POA: Diagnosis not present

## 2022-03-20 DIAGNOSIS — M9905 Segmental and somatic dysfunction of pelvic region: Secondary | ICD-10-CM | POA: Diagnosis not present

## 2022-03-24 DIAGNOSIS — M62830 Muscle spasm of back: Secondary | ICD-10-CM | POA: Diagnosis not present

## 2022-03-24 DIAGNOSIS — M955 Acquired deformity of pelvis: Secondary | ICD-10-CM | POA: Diagnosis not present

## 2022-03-24 DIAGNOSIS — M9903 Segmental and somatic dysfunction of lumbar region: Secondary | ICD-10-CM | POA: Diagnosis not present

## 2022-03-24 DIAGNOSIS — M9905 Segmental and somatic dysfunction of pelvic region: Secondary | ICD-10-CM | POA: Diagnosis not present

## 2022-03-27 DIAGNOSIS — M955 Acquired deformity of pelvis: Secondary | ICD-10-CM | POA: Diagnosis not present

## 2022-03-27 DIAGNOSIS — M62830 Muscle spasm of back: Secondary | ICD-10-CM | POA: Diagnosis not present

## 2022-03-27 DIAGNOSIS — M9905 Segmental and somatic dysfunction of pelvic region: Secondary | ICD-10-CM | POA: Diagnosis not present

## 2022-03-27 DIAGNOSIS — M9903 Segmental and somatic dysfunction of lumbar region: Secondary | ICD-10-CM | POA: Diagnosis not present

## 2022-04-02 DIAGNOSIS — M62830 Muscle spasm of back: Secondary | ICD-10-CM | POA: Diagnosis not present

## 2022-04-02 DIAGNOSIS — M9905 Segmental and somatic dysfunction of pelvic region: Secondary | ICD-10-CM | POA: Diagnosis not present

## 2022-04-02 DIAGNOSIS — M9903 Segmental and somatic dysfunction of lumbar region: Secondary | ICD-10-CM | POA: Diagnosis not present

## 2022-04-02 DIAGNOSIS — M955 Acquired deformity of pelvis: Secondary | ICD-10-CM | POA: Diagnosis not present

## 2022-04-10 DIAGNOSIS — M955 Acquired deformity of pelvis: Secondary | ICD-10-CM | POA: Diagnosis not present

## 2022-04-10 DIAGNOSIS — M9905 Segmental and somatic dysfunction of pelvic region: Secondary | ICD-10-CM | POA: Diagnosis not present

## 2022-04-10 DIAGNOSIS — M9903 Segmental and somatic dysfunction of lumbar region: Secondary | ICD-10-CM | POA: Diagnosis not present

## 2022-04-10 DIAGNOSIS — M62830 Muscle spasm of back: Secondary | ICD-10-CM | POA: Diagnosis not present

## 2022-04-17 DIAGNOSIS — M9903 Segmental and somatic dysfunction of lumbar region: Secondary | ICD-10-CM | POA: Diagnosis not present

## 2022-04-17 DIAGNOSIS — M9905 Segmental and somatic dysfunction of pelvic region: Secondary | ICD-10-CM | POA: Diagnosis not present

## 2022-04-17 DIAGNOSIS — M955 Acquired deformity of pelvis: Secondary | ICD-10-CM | POA: Diagnosis not present

## 2022-04-17 DIAGNOSIS — M62830 Muscle spasm of back: Secondary | ICD-10-CM | POA: Diagnosis not present

## 2022-04-25 DIAGNOSIS — M9905 Segmental and somatic dysfunction of pelvic region: Secondary | ICD-10-CM | POA: Diagnosis not present

## 2022-04-25 DIAGNOSIS — M9903 Segmental and somatic dysfunction of lumbar region: Secondary | ICD-10-CM | POA: Diagnosis not present

## 2022-04-25 DIAGNOSIS — M62830 Muscle spasm of back: Secondary | ICD-10-CM | POA: Diagnosis not present

## 2022-04-25 DIAGNOSIS — M955 Acquired deformity of pelvis: Secondary | ICD-10-CM | POA: Diagnosis not present

## 2022-05-02 DIAGNOSIS — M955 Acquired deformity of pelvis: Secondary | ICD-10-CM | POA: Diagnosis not present

## 2022-05-02 DIAGNOSIS — M9903 Segmental and somatic dysfunction of lumbar region: Secondary | ICD-10-CM | POA: Diagnosis not present

## 2022-05-02 DIAGNOSIS — M62830 Muscle spasm of back: Secondary | ICD-10-CM | POA: Diagnosis not present

## 2022-05-02 DIAGNOSIS — M9905 Segmental and somatic dysfunction of pelvic region: Secondary | ICD-10-CM | POA: Diagnosis not present

## 2022-05-12 DIAGNOSIS — M62830 Muscle spasm of back: Secondary | ICD-10-CM | POA: Diagnosis not present

## 2022-05-12 DIAGNOSIS — M9905 Segmental and somatic dysfunction of pelvic region: Secondary | ICD-10-CM | POA: Diagnosis not present

## 2022-05-12 DIAGNOSIS — M9903 Segmental and somatic dysfunction of lumbar region: Secondary | ICD-10-CM | POA: Diagnosis not present

## 2022-05-12 DIAGNOSIS — M955 Acquired deformity of pelvis: Secondary | ICD-10-CM | POA: Diagnosis not present

## 2022-05-28 DIAGNOSIS — M9905 Segmental and somatic dysfunction of pelvic region: Secondary | ICD-10-CM | POA: Diagnosis not present

## 2022-05-28 DIAGNOSIS — M955 Acquired deformity of pelvis: Secondary | ICD-10-CM | POA: Diagnosis not present

## 2022-05-28 DIAGNOSIS — M9903 Segmental and somatic dysfunction of lumbar region: Secondary | ICD-10-CM | POA: Diagnosis not present

## 2022-05-28 DIAGNOSIS — M62830 Muscle spasm of back: Secondary | ICD-10-CM | POA: Diagnosis not present

## 2022-06-03 DIAGNOSIS — M9903 Segmental and somatic dysfunction of lumbar region: Secondary | ICD-10-CM | POA: Diagnosis not present

## 2022-06-03 DIAGNOSIS — M62830 Muscle spasm of back: Secondary | ICD-10-CM | POA: Diagnosis not present

## 2022-06-03 DIAGNOSIS — M955 Acquired deformity of pelvis: Secondary | ICD-10-CM | POA: Diagnosis not present

## 2022-06-03 DIAGNOSIS — M9905 Segmental and somatic dysfunction of pelvic region: Secondary | ICD-10-CM | POA: Diagnosis not present

## 2022-06-05 ENCOUNTER — Ambulatory Visit (INDEPENDENT_AMBULATORY_CARE_PROVIDER_SITE_OTHER): Payer: BC Managed Care – PPO | Admitting: Family Medicine

## 2022-06-05 ENCOUNTER — Encounter: Payer: Self-pay | Admitting: Family Medicine

## 2022-06-05 VITALS — BP 118/82 | HR 74 | Ht 66.0 in | Wt 230.0 lb

## 2022-06-05 DIAGNOSIS — M545 Low back pain, unspecified: Secondary | ICD-10-CM | POA: Diagnosis not present

## 2022-06-05 DIAGNOSIS — K219 Gastro-esophageal reflux disease without esophagitis: Secondary | ICD-10-CM

## 2022-06-05 DIAGNOSIS — Z8249 Family history of ischemic heart disease and other diseases of the circulatory system: Secondary | ICD-10-CM

## 2022-06-05 DIAGNOSIS — M533 Sacrococcygeal disorders, not elsewhere classified: Secondary | ICD-10-CM

## 2022-06-05 DIAGNOSIS — M222X1 Patellofemoral disorders, right knee: Secondary | ICD-10-CM

## 2022-06-05 DIAGNOSIS — E782 Mixed hyperlipidemia: Secondary | ICD-10-CM | POA: Diagnosis not present

## 2022-06-05 DIAGNOSIS — M7711 Lateral epicondylitis, right elbow: Secondary | ICD-10-CM

## 2022-06-05 DIAGNOSIS — G8929 Other chronic pain: Secondary | ICD-10-CM

## 2022-06-05 DIAGNOSIS — M222X2 Patellofemoral disorders, left knee: Secondary | ICD-10-CM

## 2022-06-05 MED ORDER — ROSUVASTATIN CALCIUM 5 MG PO TABS: 5.0000 mg | ORAL_TABLET | Freq: Every day | ORAL | 3 refills | Status: DC

## 2022-06-05 MED ORDER — PANTOPRAZOLE SODIUM 40 MG PO TBEC: 40.0000 mg | DELAYED_RELEASE_TABLET | Freq: Every day | ORAL | 3 refills | Status: DC

## 2022-06-05 MED ORDER — MELOXICAM 15 MG PO TABS: 15.0000 mg | ORAL_TABLET | Freq: Every day | ORAL | 1 refills | Status: DC | PRN

## 2022-06-05 NOTE — Assessment & Plan Note (Signed)
>>  ASSESSMENT AND PLAN FOR CHRONIC LOW BACK PAIN WITHOUT SCIATICA WRITTEN ON 06/05/2022 12:54 PM BY Jennice Renegar, Ocie Bob, MD  Chronic and stable condition well-controlled with as needed meloxicam.

## 2022-06-05 NOTE — Assessment & Plan Note (Signed)
See additional assessment(s) for plan details. 

## 2022-06-05 NOTE — Assessment & Plan Note (Signed)
Right-hand-dominant patient also brings up several week history of right forearm pain, feels that he struck his arm while walking.  Pain to the antecubital and lateral elbow with mild radiation to the forearm, aggravated by ADLs.  Does have intermittent episodes of radiation proximally to the shoulder, denies any paresthesias.  Examination consistent with focal tenderness of the lateral condyle, secondarily to the biceps tendon distally, provocative testing consistent with biceps tendinitis and primarily right lateral epicondylitis.  I have advised to dose meloxicam, topical Voltaren, and home exercises.  Recalcitrant symptoms can be addressed with cortisone injections and/or formal physical therapy.

## 2022-06-05 NOTE — Assessment & Plan Note (Signed)
Chronic issue well-controlled with as needed meloxicam, continue this regimen.

## 2022-06-05 NOTE — Assessment & Plan Note (Addendum)
Tolerating statin well, plan for lab recheck.  Has stressed importance of lifestyle/dietary changes which he has been working on.  Patient raise concern for significant family history of members needing bypass, having MIs in their 30s.  I discussed overall risk reduction strategies including lifestyle changes, he is currently dosing statin and we will recheck labs.  We will also place a referral to cardiology for further evaluation and risk stratification.

## 2022-06-05 NOTE — Progress Notes (Signed)
Primary Care / Sports Medicine Office Visit  Patient Information:  Patient ID: Sean Freeman, male DOB: [birth year] Age: 36 y.o. MRN: [id number]   Sean Freeman is a pleasant 36 y.o. male presenting with the following:  Chief Complaint  Patient presents with   Follow-up    Pt here to follow up from CPE. States he is tolerating medication well. Needs 90 days on mediation per insurance    Vitals:   06/05/22 0925  BP: 118/82  Pulse: 74  SpO2: 98%   Vitals:   06/05/22 0925  Weight: 230 lb (104.3 kg)  Height: 5\' 6"  (1.676 m)   Body mass index is 37.12 kg/m.  No results found.   Independent interpretation of notes and tests performed by another provider:   None  Procedures performed:   None  Pertinent History, Exam, Impression, and Recommendations:   Problem List Items Addressed This Visit       Digestive   Gastroesophageal reflux disease   Relevant Medications   pantoprazole (PROTONIX) 40 MG tablet     Musculoskeletal and Integument   Patellofemoral syndrome, bilateral   Relevant Medications   meloxicam (MOBIC) 15 MG tablet   Right lateral epicondylitis    Right-hand-dominant patient also brings up several week history of right forearm pain, feels that he struck his arm while walking.  Pain to the antecubital and lateral elbow with mild radiation to the forearm, aggravated by ADLs.  Does have intermittent episodes of radiation proximally to the shoulder, denies any paresthesias.  Examination consistent with focal tenderness of the lateral condyle, secondarily to the biceps tendon distally, provocative testing consistent with biceps tendinitis and primarily right lateral epicondylitis.  I have advised to dose meloxicam, topical Voltaren, and home exercises.  Recalcitrant symptoms can be addressed with cortisone injections and/or formal physical therapy.      Relevant Medications   meloxicam (MOBIC) 15 MG tablet     Other   Chronic low back pain without  sciatica    Chronic and stable condition well-controlled with as needed meloxicam.      Relevant Medications   meloxicam (MOBIC) 15 MG tablet   SI (sacroiliac) pain    Chronic issue well-controlled with as needed meloxicam, continue this regimen.      Relevant Medications   meloxicam (MOBIC) 15 MG tablet   Mixed hyperlipidemia - Primary    Tolerating statin well, plan for lab recheck.  Has stressed importance of lifestyle/dietary changes which he has been working on.  Patient raise concern for significant family history of members needing bypass, having MIs in their 30s.  I discussed overall risk reduction strategies including lifestyle changes, he is currently dosing statin and we will recheck labs.  We will also place a referral to cardiology for further evaluation and risk stratification.      Relevant Medications   rosuvastatin (CRESTOR) 5 MG tablet   Other Relevant Orders   Apo A1 + B + Ratio   Comprehensive metabolic panel   Lipid panel   Ambulatory referral to Cardiology   Family history of premature coronary heart disease    See additional assessment(s) for plan details.      Relevant Orders   Ambulatory referral to Cardiology     Orders & Medications Meds ordered this encounter  Medications   rosuvastatin (CRESTOR) 5 MG tablet    Sig: Take 1 tablet (5 mg total) by mouth daily.    Dispense:  90 tablet    Refill:  3   pantoprazole (PROTONIX) 40 MG tablet    Sig: Take 1 tablet (40 mg total) by mouth daily.    Dispense:  90 tablet    Refill:  3   meloxicam (MOBIC) 15 MG tablet    Sig: Take 1 tablet (15 mg total) by mouth daily as needed for pain.    Dispense:  30 tablet    Refill:  1   Orders Placed This Encounter  Procedures   Apo A1 + B + Ratio   Comprehensive metabolic panel   Lipid panel   Ambulatory referral to Cardiology     Return in about 3 months (around 09/05/2022).     Jerrol Banana, MD   Primary Care Sports Medicine [facility]

## 2022-06-05 NOTE — Assessment & Plan Note (Signed)
Chronic and stable condition well-controlled with as needed meloxicam.

## 2022-06-11 DIAGNOSIS — M62830 Muscle spasm of back: Secondary | ICD-10-CM | POA: Diagnosis not present

## 2022-06-11 DIAGNOSIS — M9903 Segmental and somatic dysfunction of lumbar region: Secondary | ICD-10-CM | POA: Diagnosis not present

## 2022-06-11 DIAGNOSIS — M9905 Segmental and somatic dysfunction of pelvic region: Secondary | ICD-10-CM | POA: Diagnosis not present

## 2022-06-11 DIAGNOSIS — M955 Acquired deformity of pelvis: Secondary | ICD-10-CM | POA: Diagnosis not present

## 2022-06-12 ENCOUNTER — Ambulatory Visit: Payer: BC Managed Care – PPO | Admitting: Family Medicine

## 2022-06-12 DIAGNOSIS — E782 Mixed hyperlipidemia: Secondary | ICD-10-CM | POA: Diagnosis not present

## 2022-06-13 LAB — COMPREHENSIVE METABOLIC PANEL
ALT: 52 IU/L — ABNORMAL HIGH (ref 0–44)
AST: 29 IU/L (ref 0–40)
Albumin/Globulin Ratio: 2 (ref 1.2–2.2)
Albumin: 4.8 g/dL (ref 4.0–5.0)
Alkaline Phosphatase: 108 IU/L (ref 44–121)
BUN/Creatinine Ratio: 11 (ref 9–20)
BUN: 14 mg/dL (ref 6–20)
Bilirubin Total: 0.4 mg/dL (ref 0.0–1.2)
CO2: 21 mmol/L (ref 20–29)
Calcium: 10.4 mg/dL — ABNORMAL HIGH (ref 8.7–10.2)
Chloride: 102 mmol/L (ref 96–106)
Creatinine, Ser: 1.25 mg/dL (ref 0.76–1.27)
Globulin, Total: 2.4 g/dL (ref 1.5–4.5)
Glucose: 104 mg/dL — ABNORMAL HIGH (ref 70–99)
Potassium: 4.8 mmol/L (ref 3.5–5.2)
Sodium: 139 mmol/L (ref 134–144)
Total Protein: 7.2 g/dL (ref 6.0–8.5)
eGFR: 77 mL/min/{1.73_m2} (ref >59)

## 2022-06-13 LAB — LIPID PANEL
Chol/HDL Ratio: 6.3 ratio — ABNORMAL HIGH (ref 0.0–5.0)
Cholesterol, Total: 194 mg/dL (ref 100–199)
HDL: 31 mg/dL — ABNORMAL LOW (ref >39)
LDL Chol Calc (NIH): 114 mg/dL — ABNORMAL HIGH (ref 0–99)
Triglycerides: 282 mg/dL — ABNORMAL HIGH (ref 0–149)
VLDL Cholesterol Cal: 49 mg/dL — ABNORMAL HIGH (ref 5–40)

## 2022-06-13 LAB — APO A1 + B + RATIO
Apolipo. B/A-1 Ratio: 1 ratio — ABNORMAL HIGH (ref 0.0–0.7)
Apolipoprotein A-1: 121 mg/dL (ref 101–178)
Apolipoprotein B: 117 mg/dL — ABNORMAL HIGH (ref <90)

## 2022-06-18 DIAGNOSIS — M9905 Segmental and somatic dysfunction of pelvic region: Secondary | ICD-10-CM | POA: Diagnosis not present

## 2022-06-18 DIAGNOSIS — M9903 Segmental and somatic dysfunction of lumbar region: Secondary | ICD-10-CM | POA: Diagnosis not present

## 2022-06-18 DIAGNOSIS — M62830 Muscle spasm of back: Secondary | ICD-10-CM | POA: Diagnosis not present

## 2022-06-18 DIAGNOSIS — M955 Acquired deformity of pelvis: Secondary | ICD-10-CM | POA: Diagnosis not present

## 2022-06-30 ENCOUNTER — Encounter: Payer: Self-pay | Admitting: Cardiology

## 2022-06-30 ENCOUNTER — Ambulatory Visit (INDEPENDENT_AMBULATORY_CARE_PROVIDER_SITE_OTHER): Payer: BC Managed Care – PPO | Admitting: Cardiology

## 2022-06-30 VITALS — BP 130/102 | HR 79 | Ht 67.0 in | Wt 227.4 lb

## 2022-06-30 DIAGNOSIS — R0609 Other forms of dyspnea: Secondary | ICD-10-CM

## 2022-06-30 DIAGNOSIS — E782 Mixed hyperlipidemia: Secondary | ICD-10-CM

## 2022-06-30 MED ORDER — ROSUVASTATIN CALCIUM 20 MG PO TABS: 20.0000 mg | ORAL_TABLET | Freq: Every day | ORAL | 5 refills | Status: DC

## 2022-06-30 MED ORDER — METOPROLOL TARTRATE 100 MG PO TABS: 100.0000 mg | ORAL_TABLET | Freq: Once | ORAL | 0 refills | Status: DC

## 2022-06-30 MED ORDER — IVABRADINE HCL 5 MG PO TABS
10.0000 mg | ORAL_TABLET | Freq: Once | ORAL | 0 refills | Status: AC
Start: 2022-06-30 — End: 2022-06-30

## 2022-06-30 NOTE — Progress Notes (Signed)
Cardiology Office Note:    Date:  06/30/2022   ID:  [name], DOB June 21, 1986, MRN [id number]  PCP:  Jerrol Banana, MD   [facility] Providers Cardiologist:  None     Referring MD: Jerrol Banana, MD   Chief Complaint  Patient presents with   Other    HLD c/o back lower pain.  Meds reviewed verbally with pt.   Sean Freeman is a 36 y.o. male who is being seen today for the evaluation of hyperlipidemia at the request of Jerrol Banana, MD.   History of Present Illness:    Sean Freeman is a 36 y.o. male with a hx of hyperlipidemia, GERD who presents due to hyperlipidemia and family history of early CAD.  He states having shortness of breath with exertion beginning December 2021.  He thinks he might of had COVID infection, he has noticed shortness of breath with exertion over this time.  Endorses having heartburn, controlled with Protonix.  Recently diagnosed with high cholesterol, started on Crestor 5 mg daily, tolerating Crestor without any adverse effects.  Patient worried due to family history of coronary artery disease in her uncles on mother's side.  All current history of heart attacks and or bypass prior to age 64.  Due to his abnormal cholesterol levels, patient wanted to get tested.  Denies smoking.  Past Medical History:  Diagnosis Date   Allergy    GERD (gastroesophageal reflux disease)    Hyperlipidemia     Past Surgical History:  Procedure Laterality Date   TYMPANOSTOMY TUBE PLACEMENT Bilateral     Current Medications: Current Meds  Medication Sig   ivabradine (CORLANOR) 5 MG TABS tablet Take 2 tablets (10 mg total) by mouth once for 1 dose. Take 2 hours prior to your scan.   meloxicam (MOBIC) 15 MG tablet Take 1 tablet (15 mg total) by mouth daily as needed for pain.   metoprolol tartrate (LOPRESSOR) 100 MG tablet Take 1 tablet (100 mg total) by mouth once for 1 dose. Take 2 hours prior to your CT scan.   pantoprazole (PROTONIX) 40 MG  tablet Take 1 tablet (40 mg total) by mouth daily.   [DISCONTINUED] rosuvastatin (CRESTOR) 5 MG tablet Take 1 tablet (5 mg total) by mouth daily.     Allergies:   Amoxicillin and Penicillins   Social History   Socioeconomic History   Marital status: Single    Spouse name: Not on file   Number of children: 2   Years of education: 12   Highest education level: High school graduate  Occupational History   Not on file  Tobacco Use   Smoking status: Never   Smokeless tobacco: Never  Vaping Use   Vaping Use: Never used  Substance and Sexual Activity   Alcohol use: Yes    Comment: social   Drug use: Never   Sexual activity: Yes    Birth control/protection: Condom  Other Topics Concern   Not on file  Social History Narrative   Not on file   Social Determinants of Health   Financial Resource Strain: Not on file  Food Insecurity: Not on file  Transportation Needs: Not on file  Physical Activity: Not on file  Stress: Not on file  Social Connections: Not on file     Family History: The patient's family history includes Allergies in his brother; Diabetes in his maternal grandfather, maternal grandmother, mother, paternal uncle, paternal uncle, and paternal uncle; Heart attack in  his maternal grandfather, maternal grandmother, paternal uncle, paternal uncle, and paternal uncle.  ROS:   Please see the history of present illness.     All other systems reviewed and are negative.  EKGs/Labs/Other Studies Reviewed:    The following studies were reviewed today:   EKG:  EKG is  ordered today.  The ekg ordered today demonstrates normal sinus rhythm, normal ECG  Recent Labs: 03/17/2022: Hemoglobin 14.5; Platelets 239; TSH 3.490 06/12/2022: ALT 52; BUN 14; Creatinine, Ser 1.25; Potassium 4.8; Sodium 139  Recent Lipid Panel    Component Value Date/Time   CHOL 194 06/12/2022 1053   TRIG 282 (H) 06/12/2022 1053   HDL 31 (L) 06/12/2022 1053   CHOLHDL 6.3 (H) 06/12/2022 1053    LDLCALC 114 (H) 06/12/2022 1053     Risk Assessment/Calculations:          Physical Exam:    VS:  BP (!) 130/102 (BP Location: Right Arm, Patient Position: Sitting, Cuff Size: Large)   Pulse 79   Ht 5\' 7"  (1.702 m)   Wt 227 lb 6 oz (103.1 kg)   SpO2 98%   BMI 35.61 kg/m     Wt Readings from Last 3 Encounters:  06/30/22 227 lb 6 oz (103.1 kg)  06/05/22 230 lb (104.3 kg)  03/14/22 231 lb (104.8 kg)     GEN:  Well nourished, well developed in no acute distress HEENT: Normal NECK: No JVD; No carotid bruits LYMPHATICS: No lymphadenopathy CARDIAC: RRR, no murmurs, rubs, gallops RESPIRATORY:  Clear to auscultation without rales, wheezing or rhonchi  ABDOMEN: Soft, non-tender, non-distended MUSCULOSKELETAL:  No edema; No deformity  SKIN: Warm and dry NEUROLOGIC:  Alert and oriented x 3 PSYCHIATRIC:  Normal affect   ASSESSMENT:    1. Dyspnea on exertion   2. Mixed hyperlipidemia    PLAN:    In order of problems listed above:  Dyspnea on exertion, this could be an anginal equivalent, risk factors hyperlipidemia.  Get echo, get coronary CTA. Hyperlipidemia, triglycerides, LDL still elevated.  Increase Crestor to 20 mg daily.  Repeat lipid panel in 3 months.  Follow-up after echo and coronary CTA.      Medication Adjustments/Labs and Tests Ordered: Current medicines are reviewed at length with the patient today.  Concerns regarding medicines are outlined above.  Orders Placed This Encounter  Procedures   CT CORONARY MORPH W/CTA COR W/SCORE W/CA W/CM &/OR WO/CM   Lipid panel   ECHOCARDIOGRAM COMPLETE   Meds ordered this encounter  Medications   metoprolol tartrate (LOPRESSOR) 100 MG tablet    Sig: Take 1 tablet (100 mg total) by mouth once for 1 dose. Take 2 hours prior to your CT scan.    Dispense:  1 tablet    Refill:  0   ivabradine (CORLANOR) 5 MG TABS tablet    Sig: Take 2 tablets (10 mg total) by mouth once for 1 dose. Take 2 hours prior to your scan.     Dispense:  2 tablet    Refill:  0   rosuvastatin (CRESTOR) 20 MG tablet    Sig: Take 1 tablet (20 mg total) by mouth daily.    Dispense:  30 tablet    Refill:  5    Patient Instructions  Medication Instructions:   Your physician has recommended you make the following change in your medication:    INCREASE your Rosuvastatin to 20 mg once a day.  *If you need a refill on your cardiac medications before your  next appointment, please call your pharmacy*   Lab Work:  Your physician recommends that you return for a FASTING lipid profile:  IN 3 MONTHS  - You will need to be fasting. Please do not have anything to eat or drink after midnight the morning you have the lab work. You may only have water or black coffee with no cream or sugar.   - Please go to the [facility]. You will check in at the front desk to the right as you walk into the atrium. Valet Parking is offered if needed. - No appointment needed. You may go any day between 7 am and 6 pm.     Testing/Procedures:  Your physician has requested that you have an echocardiogram. Echocardiography is a painless test that uses sound waves to create images of your heart. It provides your doctor with information about the size and shape of your heart and how well your heart's chambers and valves are working. This procedure takes approximately one hour. There are no restrictions for this procedure.  2.    Your physician has requested that you have cardiac CT. Cardiac computed tomography (CT) is a painless test that uses an x-ray machine to take clear, detailed pictures of your heart.    Your cardiac CT will be scheduled at:  [facility]  Monday, 7/17 at 10:00 AM  Please arrive 15 mins early for check-in and test prep.    Please follow these instructions carefully (unless otherwise directed):   On the Night Before the Test: Be sure to Drink plenty of water. Do not consume any caffeinated/decaffeinated beverages  or chocolate 12 hours prior to your test.   On the Day of the Test: Drink plenty of water until 1 hour prior to the test. Do not eat any food 4 hours prior to the test. You may take your regular medications prior to the test.  Take metoprolol (Lopressor) 100 MG two hours prior to test. Take Ivabradine (Corlanor) 10 MG two hours prior to test.        After the Test: Drink plenty of water. After receiving IV contrast, you may experience a mild flushed feeling. This is normal. On occasion, you may experience a mild rash up to 24 hours after the test. This is not dangerous. If this occurs, you can take Benadryl 25 mg and increase your fluid intake. If you experience trouble breathing, this can be serious. If it is severe call 911 IMMEDIATELY. If it is mild, please call our office. If you take any of these medications: Glipizide/Metformin, Avandament, Glucavance, please do not take 48 hours after completing test unless otherwise instructed.  Please allow 2-4 weeks for scheduling of routine cardiac CTs. Some insurance companies require a pre-authorization which may delay scheduling of this test.   For non-scheduling related questions, please contact the cardiac imaging nurse navigator should you have any questions/concerns: Rockwell Alexandria, Cardiac Imaging Nurse Navigator Larey Brick, Cardiac Imaging Nurse Navigator [facility] Direct Office Dial: [phone]   For scheduling needs, including cancellations and rescheduling, please call [place], [phone].     Follow-Up: At [facility], you and your health needs are our priority.  As part of our continuing mission to provide you with exceptional heart care, we have created designated Provider Care Teams.  These Care Teams include your primary Cardiologist (physician) and Advanced Practice Providers (APPs -  Physician Assistants and Nurse Practitioners) who all work together to provide you with the care  you  need, when you need it.  We recommend signing up for the patient portal called "MyChart".  Sign up information is provided on this After Visit Summary.  MyChart is used to connect with patients for Virtual Visits (Telemedicine).  Patients are able to view lab/test results, encounter notes, upcoming appointments, etc.  Non-urgent messages can be sent to your provider as well.   To learn more about what you can do with MyChart, go to [email or web address].    Your next appointment:   Follow up after testing   The format for your next appointment:   In Person  Provider:   You may see Debbe Odea, MD or one of the following Advanced Practice Providers on your designated Care Team:   Nicolasa Ducking, NP Eula Listen, PA-C Cadence Fransico Michael, [place]    Other Instructions   Important Information About Sugar         Signed, Debbe Odea, MD  06/30/2022 3:44 PM    [facility]

## 2022-06-30 NOTE — Patient Instructions (Signed)
Medication Instructions:   Your physician has recommended you make the following change in your medication:    INCREASE your Rosuvastatin to 20 mg once a day.  *If you need a refill on your cardiac medications before your next appointment, please call your pharmacy*   Lab Work:  Your physician recommends that you return for a FASTING lipid profile:  IN 3 MONTHS  - You will need to be fasting. Please do not have anything to eat or drink after midnight the morning you have the lab work. You may only have water or black coffee with no cream or sugar.   - Please go to the [facility]. You will check in at the front desk to the right as you walk into the atrium. Valet Parking is offered if needed. - No appointment needed. You may go any day between 7 am and 6 pm.     Testing/Procedures:  Your physician has requested that you have an echocardiogram. Echocardiography is a painless test that uses sound waves to create images of your heart. It provides your doctor with information about the size and shape of your heart and how well your heart's chambers and valves are working. This procedure takes approximately one hour. There are no restrictions for this procedure.  2.    Your physician has requested that you have cardiac CT. Cardiac computed tomography (CT) is a painless test that uses an x-ray machine to take clear, detailed pictures of your heart.    Your cardiac CT will be scheduled at:  [facility]  Monday, 7/17 at 10:00 AM  Please arrive 15 mins early for check-in and test prep.    Please follow these instructions carefully (unless otherwise directed):   On the Night Before the Test: Be sure to Drink plenty of water. Do not consume any caffeinated/decaffeinated beverages or chocolate 12 hours prior to your test.   On the Day of the Test: Drink plenty of water until 1 hour prior to the test. Do not eat any food 4 hours prior to the test. You may take your  regular medications prior to the test.  Take metoprolol (Lopressor) 100 MG two hours prior to test. Take Ivabradine (Corlanor) 10 MG two hours prior to test.        After the Test: Drink plenty of water. After receiving IV contrast, you may experience a mild flushed feeling. This is normal. On occasion, you may experience a mild rash up to 24 hours after the test. This is not dangerous. If this occurs, you can take Benadryl 25 mg and increase your fluid intake. If you experience trouble breathing, this can be serious. If it is severe call 911 IMMEDIATELY. If it is mild, please call our office. If you take any of these medications: Glipizide/Metformin, Avandament, Glucavance, please do not take 48 hours after completing test unless otherwise instructed.  Please allow 2-4 weeks for scheduling of routine cardiac CTs. Some insurance companies require a pre-authorization which may delay scheduling of this test.   For non-scheduling related questions, please contact the cardiac imaging nurse navigator should you have any questions/concerns: Rockwell Alexandria, Cardiac Imaging Nurse Navigator Larey Brick, Cardiac Imaging Nurse Navigator [place] [facility] Direct Office Dial: [phone]   For scheduling needs, including cancellations and rescheduling, please call [place], [phone].     Follow-Up: At [facility], you and your health needs are our priority.  As part of our continuing mission to provide you with exceptional  heart care, we have created designated Provider Care Teams.  These Care Teams include your primary Cardiologist (physician) and Advanced Practice Providers (APPs -  Physician Assistants and Nurse Practitioners) who all work together to provide you with the care you need, when you need it.  We recommend signing up for the patient portal called "MyChart".  Sign up information is provided on this After Visit Summary.  MyChart is used to connect with  patients for Virtual Visits (Telemedicine).  Patients are able to view lab/test results, encounter notes, upcoming appointments, etc.  Non-urgent messages can be sent to your provider as well.   To learn more about what you can do with MyChart, go to [email or web address].    Your next appointment:   Follow up after testing   The format for your next appointment:   In Person  Provider:   You may see Debbe Odea, MD or one of the following Advanced Practice Providers on your designated Care Team:   Nicolasa Ducking, NP Eula Listen, PA-C Cadence Fransico Michael, [place]    Other Instructions   Important Information About Sugar

## 2022-07-03 NOTE — Addendum Note (Signed)
Addended by: Kendrick Fries on: 07/03/2022 10:28 AM   Modules accepted: Orders

## 2022-07-04 ENCOUNTER — Telehealth (HOSPITAL_COMMUNITY): Payer: Self-pay | Admitting: Emergency Medicine

## 2022-07-04 DIAGNOSIS — M9903 Segmental and somatic dysfunction of lumbar region: Secondary | ICD-10-CM | POA: Diagnosis not present

## 2022-07-04 DIAGNOSIS — M955 Acquired deformity of pelvis: Secondary | ICD-10-CM | POA: Diagnosis not present

## 2022-07-04 DIAGNOSIS — M62830 Muscle spasm of back: Secondary | ICD-10-CM | POA: Diagnosis not present

## 2022-07-04 DIAGNOSIS — M9905 Segmental and somatic dysfunction of pelvic region: Secondary | ICD-10-CM | POA: Diagnosis not present

## 2022-07-04 NOTE — Telephone Encounter (Signed)
Reaching out to patient to offer assistance regarding upcoming cardiac imaging study; pt verbalizes understanding of appt date/time, parking situation and where to check in, pre-test NPO status and medications ordered, and verified current allergies; name and call back number provided for further questions should they arise Shalea Tomczak RN Navigator Cardiac Imaging [facility] [phone] office [phone] cell 

## 2022-07-04 NOTE — Telephone Encounter (Signed)
Attempted to call patient regarding upcoming cardiac CT appointment. °Left message on voicemail with name and callback number °Aylla Huffine RN Navigator Cardiac Imaging ° [facility] Services °[phone] Office °[phone] Cell ° °

## 2022-07-07 ENCOUNTER — Ambulatory Visit
Admission: RE | Admit: 2022-07-07 | Discharge: 2022-07-07 | Disposition: A | Discharge status: Home / Self Care | Payer: BC Managed Care – PPO | Source: Ambulatory Visit | Attending: Cardiology | Admitting: Cardiology

## 2022-07-07 DIAGNOSIS — Z8249 Family history of ischemic heart disease and other diseases of the circulatory system: Secondary | ICD-10-CM | POA: Diagnosis not present

## 2022-07-07 DIAGNOSIS — R0609 Other forms of dyspnea: Secondary | ICD-10-CM | POA: Diagnosis not present

## 2022-07-07 MED ORDER — NITROGLYCERIN 0.4 MG SL SUBL
SUBLINGUAL_TABLET | SUBLINGUAL | Status: AC
  Filled 2022-07-07: qty 2

## 2022-07-07 MED ORDER — IOHEXOL 350 MG/ML SOLN
100.0000 mL | Freq: Once | INTRAVENOUS | Status: AC | PRN
  Administered 2022-07-07: 100 mL via INTRAVENOUS

## 2022-07-07 MED ORDER — NITROGLYCERIN 0.4 MG SL SUBL
0.8000 mg | SUBLINGUAL_TABLET | Freq: Once | SUBLINGUAL | Status: AC
  Administered 2022-07-07: 0.8 mg via SUBLINGUAL

## 2022-07-07 NOTE — Progress Notes (Signed)
Patient tolerated procedure well. Ambulate w/o difficulty. Denies any lightheadedness or being dizzy. Pt denies any pain at this time. Sitting in chair, drinking water provided. Pt is encouraged to drink additional water throughout the day and reason explained to patient. Patient verbalized understanding and all questions answered. ABC intact. No further needs at this time. Discharge from procedure area w/o issues.  

## 2022-07-31 ENCOUNTER — Other Ambulatory Visit: Payer: BC Managed Care – PPO

## 2022-08-08 DIAGNOSIS — M9903 Segmental and somatic dysfunction of lumbar region: Secondary | ICD-10-CM | POA: Diagnosis not present

## 2022-08-08 DIAGNOSIS — M955 Acquired deformity of pelvis: Secondary | ICD-10-CM | POA: Diagnosis not present

## 2022-08-08 DIAGNOSIS — M9905 Segmental and somatic dysfunction of pelvic region: Secondary | ICD-10-CM | POA: Diagnosis not present

## 2022-08-08 DIAGNOSIS — M62830 Muscle spasm of back: Secondary | ICD-10-CM | POA: Diagnosis not present

## 2022-08-21 ENCOUNTER — Ambulatory Visit: Payer: BC Managed Care – PPO | Admitting: Cardiology

## 2022-08-26 ENCOUNTER — Other Ambulatory Visit: Payer: BC Managed Care – PPO

## 2022-09-05 DIAGNOSIS — M62830 Muscle spasm of back: Secondary | ICD-10-CM | POA: Diagnosis not present

## 2022-09-05 DIAGNOSIS — M9903 Segmental and somatic dysfunction of lumbar region: Secondary | ICD-10-CM | POA: Diagnosis not present

## 2022-09-05 DIAGNOSIS — M9905 Segmental and somatic dysfunction of pelvic region: Secondary | ICD-10-CM | POA: Diagnosis not present

## 2022-09-05 DIAGNOSIS — M955 Acquired deformity of pelvis: Secondary | ICD-10-CM | POA: Diagnosis not present

## 2022-09-08 ENCOUNTER — Ambulatory Visit: Payer: BC Managed Care – PPO | Admitting: Family Medicine

## 2022-09-08 ENCOUNTER — Telehealth: Payer: Self-pay | Admitting: Family Medicine

## 2022-09-08 NOTE — Telephone Encounter (Signed)
Noted on appointment change

## 2022-09-08 NOTE — Telephone Encounter (Signed)
Copied from [place] [id number]. Topic: Appointment Scheduling - Scheduling Inquiry for Clinic >> Sep 08, 2022  8:37 AM Tiffany B wrote: Patient wanted to inform PCP he did not have the echocardiogram and the follow up appointment with Cardiologist therefore he [facility] his 9:40 am appointment for today to 10/08/2022.

## 2022-09-18 ENCOUNTER — Ambulatory Visit: Payer: BC Managed Care – PPO | Attending: Cardiology

## 2022-09-18 DIAGNOSIS — R0609 Other forms of dyspnea: Secondary | ICD-10-CM

## 2022-09-18 LAB — ECHOCARDIOGRAM COMPLETE
Calc EF: 70.6 %
S' Lateral: 2.8 cm
Single Plane A2C EF: 75.9 %
Single Plane A4C EF: 68.4 %

## 2022-10-03 DIAGNOSIS — M9903 Segmental and somatic dysfunction of lumbar region: Secondary | ICD-10-CM | POA: Diagnosis not present

## 2022-10-03 DIAGNOSIS — M9905 Segmental and somatic dysfunction of pelvic region: Secondary | ICD-10-CM | POA: Diagnosis not present

## 2022-10-03 DIAGNOSIS — M955 Acquired deformity of pelvis: Secondary | ICD-10-CM | POA: Diagnosis not present

## 2022-10-03 DIAGNOSIS — M62830 Muscle spasm of back: Secondary | ICD-10-CM | POA: Diagnosis not present

## 2022-10-08 ENCOUNTER — Telehealth: Payer: Self-pay | Admitting: Family Medicine

## 2022-10-08 ENCOUNTER — Ambulatory Visit: Payer: BC Managed Care – PPO | Admitting: Family Medicine

## 2022-10-08 NOTE — Telephone Encounter (Signed)
lvm

## 2022-10-09 DIAGNOSIS — M955 Acquired deformity of pelvis: Secondary | ICD-10-CM | POA: Diagnosis not present

## 2022-10-09 DIAGNOSIS — M9903 Segmental and somatic dysfunction of lumbar region: Secondary | ICD-10-CM | POA: Diagnosis not present

## 2022-10-09 DIAGNOSIS — M62830 Muscle spasm of back: Secondary | ICD-10-CM | POA: Diagnosis not present

## 2022-10-09 DIAGNOSIS — M9905 Segmental and somatic dysfunction of pelvic region: Secondary | ICD-10-CM | POA: Diagnosis not present

## 2022-10-10 ENCOUNTER — Encounter: Payer: Self-pay | Admitting: Cardiology

## 2022-10-10 ENCOUNTER — Ambulatory Visit: Payer: BC Managed Care – PPO | Attending: Cardiology | Admitting: Cardiology

## 2022-10-16 ENCOUNTER — Encounter: Payer: Self-pay | Admitting: Family Medicine

## 2022-10-27 DIAGNOSIS — M955 Acquired deformity of pelvis: Secondary | ICD-10-CM | POA: Diagnosis not present

## 2022-10-27 DIAGNOSIS — M9905 Segmental and somatic dysfunction of pelvic region: Secondary | ICD-10-CM | POA: Diagnosis not present

## 2022-10-27 DIAGNOSIS — M9903 Segmental and somatic dysfunction of lumbar region: Secondary | ICD-10-CM | POA: Diagnosis not present

## 2022-10-27 DIAGNOSIS — M62830 Muscle spasm of back: Secondary | ICD-10-CM | POA: Diagnosis not present

## 2022-10-29 DIAGNOSIS — M62830 Muscle spasm of back: Secondary | ICD-10-CM | POA: Diagnosis not present

## 2022-10-29 DIAGNOSIS — M955 Acquired deformity of pelvis: Secondary | ICD-10-CM | POA: Diagnosis not present

## 2022-10-29 DIAGNOSIS — M9903 Segmental and somatic dysfunction of lumbar region: Secondary | ICD-10-CM | POA: Diagnosis not present

## 2022-10-29 DIAGNOSIS — M9905 Segmental and somatic dysfunction of pelvic region: Secondary | ICD-10-CM | POA: Diagnosis not present

## 2022-11-05 DIAGNOSIS — M62830 Muscle spasm of back: Secondary | ICD-10-CM | POA: Diagnosis not present

## 2022-11-05 DIAGNOSIS — M9905 Segmental and somatic dysfunction of pelvic region: Secondary | ICD-10-CM | POA: Diagnosis not present

## 2022-11-05 DIAGNOSIS — M9903 Segmental and somatic dysfunction of lumbar region: Secondary | ICD-10-CM | POA: Diagnosis not present

## 2022-11-05 DIAGNOSIS — M955 Acquired deformity of pelvis: Secondary | ICD-10-CM | POA: Diagnosis not present

## 2022-11-20 DIAGNOSIS — M955 Acquired deformity of pelvis: Secondary | ICD-10-CM | POA: Diagnosis not present

## 2022-11-20 DIAGNOSIS — M9905 Segmental and somatic dysfunction of pelvic region: Secondary | ICD-10-CM | POA: Diagnosis not present

## 2022-11-20 DIAGNOSIS — M62830 Muscle spasm of back: Secondary | ICD-10-CM | POA: Diagnosis not present

## 2022-11-20 DIAGNOSIS — M9903 Segmental and somatic dysfunction of lumbar region: Secondary | ICD-10-CM | POA: Diagnosis not present

## 2022-12-10 DIAGNOSIS — M9903 Segmental and somatic dysfunction of lumbar region: Secondary | ICD-10-CM | POA: Diagnosis not present

## 2022-12-10 DIAGNOSIS — M9905 Segmental and somatic dysfunction of pelvic region: Secondary | ICD-10-CM | POA: Diagnosis not present

## 2022-12-10 DIAGNOSIS — M955 Acquired deformity of pelvis: Secondary | ICD-10-CM | POA: Diagnosis not present

## 2022-12-10 DIAGNOSIS — M62830 Muscle spasm of back: Secondary | ICD-10-CM | POA: Diagnosis not present

## 2022-12-19 DIAGNOSIS — M955 Acquired deformity of pelvis: Secondary | ICD-10-CM | POA: Diagnosis not present

## 2022-12-19 DIAGNOSIS — M62830 Muscle spasm of back: Secondary | ICD-10-CM | POA: Diagnosis not present

## 2022-12-19 DIAGNOSIS — M9903 Segmental and somatic dysfunction of lumbar region: Secondary | ICD-10-CM | POA: Diagnosis not present

## 2022-12-19 DIAGNOSIS — M9905 Segmental and somatic dysfunction of pelvic region: Secondary | ICD-10-CM | POA: Diagnosis not present

## 2022-12-30 DIAGNOSIS — M62830 Muscle spasm of back: Secondary | ICD-10-CM | POA: Diagnosis not present

## 2022-12-30 DIAGNOSIS — M9903 Segmental and somatic dysfunction of lumbar region: Secondary | ICD-10-CM | POA: Diagnosis not present

## 2022-12-30 DIAGNOSIS — M9905 Segmental and somatic dysfunction of pelvic region: Secondary | ICD-10-CM | POA: Diagnosis not present

## 2022-12-30 DIAGNOSIS — M955 Acquired deformity of pelvis: Secondary | ICD-10-CM | POA: Diagnosis not present

## 2023-01-21 DIAGNOSIS — M9903 Segmental and somatic dysfunction of lumbar region: Secondary | ICD-10-CM | POA: Diagnosis not present

## 2023-01-21 DIAGNOSIS — M62830 Muscle spasm of back: Secondary | ICD-10-CM | POA: Diagnosis not present

## 2023-01-21 DIAGNOSIS — M9905 Segmental and somatic dysfunction of pelvic region: Secondary | ICD-10-CM | POA: Diagnosis not present

## 2023-01-21 DIAGNOSIS — M955 Acquired deformity of pelvis: Secondary | ICD-10-CM | POA: Diagnosis not present

## 2023-02-12 DIAGNOSIS — M955 Acquired deformity of pelvis: Secondary | ICD-10-CM | POA: Diagnosis not present

## 2023-02-12 DIAGNOSIS — M9905 Segmental and somatic dysfunction of pelvic region: Secondary | ICD-10-CM | POA: Diagnosis not present

## 2023-02-12 DIAGNOSIS — M62830 Muscle spasm of back: Secondary | ICD-10-CM | POA: Diagnosis not present

## 2023-02-12 DIAGNOSIS — M9903 Segmental and somatic dysfunction of lumbar region: Secondary | ICD-10-CM | POA: Diagnosis not present

## 2023-02-27 DIAGNOSIS — J101 Influenza due to other identified influenza virus with other respiratory manifestations: Secondary | ICD-10-CM | POA: Diagnosis not present

## 2023-02-27 DIAGNOSIS — R051 Acute cough: Secondary | ICD-10-CM | POA: Diagnosis not present

## 2023-02-27 DIAGNOSIS — R0602 Shortness of breath: Secondary | ICD-10-CM | POA: Diagnosis not present

## 2023-02-27 DIAGNOSIS — R52 Pain, unspecified: Secondary | ICD-10-CM | POA: Diagnosis not present

## 2023-03-02 DIAGNOSIS — M62830 Muscle spasm of back: Secondary | ICD-10-CM | POA: Diagnosis not present

## 2023-03-02 DIAGNOSIS — M9903 Segmental and somatic dysfunction of lumbar region: Secondary | ICD-10-CM | POA: Diagnosis not present

## 2023-03-02 DIAGNOSIS — M9905 Segmental and somatic dysfunction of pelvic region: Secondary | ICD-10-CM | POA: Diagnosis not present

## 2023-03-02 DIAGNOSIS — M955 Acquired deformity of pelvis: Secondary | ICD-10-CM | POA: Diagnosis not present

## 2023-03-25 DIAGNOSIS — M955 Acquired deformity of pelvis: Secondary | ICD-10-CM | POA: Diagnosis not present

## 2023-03-25 DIAGNOSIS — M9903 Segmental and somatic dysfunction of lumbar region: Secondary | ICD-10-CM | POA: Diagnosis not present

## 2023-03-25 DIAGNOSIS — M9905 Segmental and somatic dysfunction of pelvic region: Secondary | ICD-10-CM | POA: Diagnosis not present

## 2023-03-25 DIAGNOSIS — M62830 Muscle spasm of back: Secondary | ICD-10-CM | POA: Diagnosis not present

## 2023-04-08 ENCOUNTER — Telehealth: Payer: Self-pay

## 2023-04-08 NOTE — Telephone Encounter (Signed)
Called pt left VM to call back. Pt needs an appt.  KP

## 2023-04-13 DIAGNOSIS — M62830 Muscle spasm of back: Secondary | ICD-10-CM | POA: Diagnosis not present

## 2023-04-13 DIAGNOSIS — M9903 Segmental and somatic dysfunction of lumbar region: Secondary | ICD-10-CM | POA: Diagnosis not present

## 2023-04-13 DIAGNOSIS — M9905 Segmental and somatic dysfunction of pelvic region: Secondary | ICD-10-CM | POA: Diagnosis not present

## 2023-04-13 DIAGNOSIS — M955 Acquired deformity of pelvis: Secondary | ICD-10-CM | POA: Diagnosis not present

## 2023-04-20 DIAGNOSIS — M62830 Muscle spasm of back: Secondary | ICD-10-CM | POA: Diagnosis not present

## 2023-04-20 DIAGNOSIS — M9905 Segmental and somatic dysfunction of pelvic region: Secondary | ICD-10-CM | POA: Diagnosis not present

## 2023-04-20 DIAGNOSIS — M955 Acquired deformity of pelvis: Secondary | ICD-10-CM | POA: Diagnosis not present

## 2023-04-20 DIAGNOSIS — M9903 Segmental and somatic dysfunction of lumbar region: Secondary | ICD-10-CM | POA: Diagnosis not present

## 2023-04-24 DIAGNOSIS — M955 Acquired deformity of pelvis: Secondary | ICD-10-CM | POA: Diagnosis not present

## 2023-04-24 DIAGNOSIS — M9903 Segmental and somatic dysfunction of lumbar region: Secondary | ICD-10-CM | POA: Diagnosis not present

## 2023-04-24 DIAGNOSIS — M62830 Muscle spasm of back: Secondary | ICD-10-CM | POA: Diagnosis not present

## 2023-04-24 DIAGNOSIS — M9905 Segmental and somatic dysfunction of pelvic region: Secondary | ICD-10-CM | POA: Diagnosis not present

## 2023-04-29 DIAGNOSIS — M62830 Muscle spasm of back: Secondary | ICD-10-CM | POA: Diagnosis not present

## 2023-04-29 DIAGNOSIS — M9905 Segmental and somatic dysfunction of pelvic region: Secondary | ICD-10-CM | POA: Diagnosis not present

## 2023-04-29 DIAGNOSIS — M955 Acquired deformity of pelvis: Secondary | ICD-10-CM | POA: Diagnosis not present

## 2023-04-29 DIAGNOSIS — M9903 Segmental and somatic dysfunction of lumbar region: Secondary | ICD-10-CM | POA: Diagnosis not present

## 2023-05-02 DIAGNOSIS — M9903 Segmental and somatic dysfunction of lumbar region: Secondary | ICD-10-CM | POA: Diagnosis not present

## 2023-05-02 DIAGNOSIS — M9905 Segmental and somatic dysfunction of pelvic region: Secondary | ICD-10-CM | POA: Diagnosis not present

## 2023-05-02 DIAGNOSIS — M955 Acquired deformity of pelvis: Secondary | ICD-10-CM | POA: Diagnosis not present

## 2023-05-02 DIAGNOSIS — M62830 Muscle spasm of back: Secondary | ICD-10-CM | POA: Diagnosis not present

## 2023-05-05 DIAGNOSIS — M9905 Segmental and somatic dysfunction of pelvic region: Secondary | ICD-10-CM | POA: Diagnosis not present

## 2023-05-05 DIAGNOSIS — M955 Acquired deformity of pelvis: Secondary | ICD-10-CM | POA: Diagnosis not present

## 2023-05-05 DIAGNOSIS — M9903 Segmental and somatic dysfunction of lumbar region: Secondary | ICD-10-CM | POA: Diagnosis not present

## 2023-05-05 DIAGNOSIS — M62830 Muscle spasm of back: Secondary | ICD-10-CM | POA: Diagnosis not present

## 2023-05-07 DIAGNOSIS — M9905 Segmental and somatic dysfunction of pelvic region: Secondary | ICD-10-CM | POA: Diagnosis not present

## 2023-05-07 DIAGNOSIS — M62830 Muscle spasm of back: Secondary | ICD-10-CM | POA: Diagnosis not present

## 2023-05-07 DIAGNOSIS — M9903 Segmental and somatic dysfunction of lumbar region: Secondary | ICD-10-CM | POA: Diagnosis not present

## 2023-05-07 DIAGNOSIS — M955 Acquired deformity of pelvis: Secondary | ICD-10-CM | POA: Diagnosis not present

## 2023-05-19 DIAGNOSIS — M955 Acquired deformity of pelvis: Secondary | ICD-10-CM | POA: Diagnosis not present

## 2023-05-19 DIAGNOSIS — M9903 Segmental and somatic dysfunction of lumbar region: Secondary | ICD-10-CM | POA: Diagnosis not present

## 2023-05-19 DIAGNOSIS — M9905 Segmental and somatic dysfunction of pelvic region: Secondary | ICD-10-CM | POA: Diagnosis not present

## 2023-05-19 DIAGNOSIS — M62830 Muscle spasm of back: Secondary | ICD-10-CM | POA: Diagnosis not present

## 2023-05-27 DIAGNOSIS — M955 Acquired deformity of pelvis: Secondary | ICD-10-CM | POA: Diagnosis not present

## 2023-05-27 DIAGNOSIS — M9905 Segmental and somatic dysfunction of pelvic region: Secondary | ICD-10-CM | POA: Diagnosis not present

## 2023-05-27 DIAGNOSIS — M9903 Segmental and somatic dysfunction of lumbar region: Secondary | ICD-10-CM | POA: Diagnosis not present

## 2023-05-27 DIAGNOSIS — M62830 Muscle spasm of back: Secondary | ICD-10-CM | POA: Diagnosis not present

## 2023-08-20 ENCOUNTER — Ambulatory Visit
Admission: RE | Admit: 2023-08-20 | Discharge: 2023-08-20 | Disposition: A | Discharge status: Home / Self Care | Payer: 59 | Source: Home / Self Care | Attending: Family Medicine | Admitting: Family Medicine

## 2023-08-20 ENCOUNTER — Encounter: Payer: Self-pay | Admitting: Family Medicine

## 2023-08-20 ENCOUNTER — Ambulatory Visit
Admission: RE | Admit: 2023-08-20 | Discharge: 2023-08-20 | Disposition: A | Discharge status: Home / Self Care | Payer: 59 | Source: Ambulatory Visit | Attending: Family Medicine | Admitting: Family Medicine

## 2023-08-20 ENCOUNTER — Ambulatory Visit (INDEPENDENT_AMBULATORY_CARE_PROVIDER_SITE_OTHER): Payer: 59 | Admitting: Family Medicine

## 2023-08-20 ENCOUNTER — Ambulatory Visit: Payer: Self-pay

## 2023-08-20 VITALS — BP 122/78 | HR 80 | Ht 67.0 in | Wt 230.0 lb

## 2023-08-20 DIAGNOSIS — M5412 Radiculopathy, cervical region: Secondary | ICD-10-CM | POA: Diagnosis not present

## 2023-08-20 DIAGNOSIS — M542 Cervicalgia: Secondary | ICD-10-CM

## 2023-08-20 MED ORDER — PREDNISONE 50 MG PO TABS: 50.0000 mg | ORAL_TABLET | Freq: Every day | ORAL | 0 refills | Status: DC

## 2023-08-20 MED ORDER — BACLOFEN 20 MG PO TABS: 10.0000 mg | ORAL_TABLET | Freq: Three times a day (TID) | ORAL | 1 refills | Status: DC | PRN

## 2023-08-20 MED ORDER — CELECOXIB 100 MG PO CAPS: 100.0000 mg | ORAL_CAPSULE | Freq: Two times a day (BID) | ORAL | 0 refills | Status: DC

## 2023-08-20 MED ORDER — GABAPENTIN 100 MG PO CAPS: 100.0000 mg | ORAL_CAPSULE | Freq: Every day | ORAL | 0 refills | Status: DC

## 2023-08-20 NOTE — Telephone Encounter (Signed)
  Chief Complaint: numbness form lfet elbow to hand and to only thumb, index and middle finger Symptoms: neck pain  Frequency: Sat am  Pertinent Negatives: Patient denies weakness or numbness to left upper arm, right arms, legs or face, no speech or vision problems Disposition: [] ED /[] Urgent Care (no appt availability in office) / [x] Appointment(In office/virtual)/ []  [place] [facility]/ [] Home Care/ [] Refused Recommended Disposition /[] [place] [facility]/ []  Follow-up with PCP Additional Notes: - Reason for Disposition  [1] Numbness (i.e., loss of sensation) of the face, arm / hand, or leg / foot on one side of the body AND [2] gradual onset (e.g., days to weeks) AND [3] present now  Answer Assessment - Initial Assessment Questions 1. SYMPTOM: "What is the main symptom you are concerned about?" (e.g., weakness, numbness)     Numbness from elbow to hand thumb. Pointer and middle ringer tips - pressure  2. ONSET: "When did this start?" (minutes, hours, days; while sleeping)     Sat am  3. LAST NORMAL: "When was the last time you (the patient) were normal (no symptoms)?"     *No Answer* 4. PATTERN "Does this come and go, or has it been constant since it started?"  "Is it present now?"     Constant  5. CARDIAC SYMPTOMS: "Have you had any of the following symptoms: chest pain, difficulty breathing, palpitations?"     no 6. NEUROLOGIC SYMPTOMS: "Have you had any of the following symptoms: headache, dizziness, vision loss, double vision, changes in speech, unsteady on your feet?"     no 7. OTHER SYMPTOMS: "Do you have any other symptoms?"     Back pain (C6)  Protocols used: Neurologic Deficit-A-AH

## 2023-08-21 DIAGNOSIS — M4722 Other spondylosis with radiculopathy, cervical region: Secondary | ICD-10-CM | POA: Insufficient documentation

## 2023-08-21 DIAGNOSIS — M5412 Radiculopathy, cervical region: Secondary | ICD-10-CM | POA: Insufficient documentation

## 2023-08-21 NOTE — Assessment & Plan Note (Signed)
Acute on chronic concern primarily with cervicothoracic tightness for years being treated by chiropractic care. After an adjustment session recently, began to note progressively worsening symptoms with repeated adjustments with numbness into the radial upper arm on left to the first 3 digits. No weakness.  Exam with positive left Spurlings, paraspinal tightness, sensorimotor intact, ROM full, painful  He has evidence of cervical radiculopathy  Plan: - Obtain x-rays - Hold from further chiropractor treatments until further evaluation completed - Start Celebrex scheduled, gabapentin nightly, and baclofen as-needed - Can use prednisone course if symptoms fail to show response in a few days - Contact for issues - Follow-up as scheduled

## 2023-08-21 NOTE — Patient Instructions (Signed)
-   Obtain x-rays - Hold from further chiropractor treatments until further evaluation completed - Start Celebrex scheduled, gabapentin nightly, and baclofen as-needed - Can use prednisone course if symptoms fail to show response in a few days - Contact for issues - Follow-up as scheduled

## 2023-08-21 NOTE — Progress Notes (Signed)
Primary Care / Sports Medicine Office Visit  Patient Information:  Patient ID: Sean Freeman, male DOB: 11-09-1986 Age: 37 y.o. MRN: [id number]   Sean Freeman is a pleasant 37 y.o. male presenting with the following:  Chief Complaint  Patient presents with   hand numbness    Half hand numb, from elbow, feels little in bicep, some neck pain.     Vitals:   08/20/23 1047  BP: 122/78  Pulse: 80  SpO2: 98%   Vitals:   08/20/23 1047  Weight: 230 lb (104.3 kg)  Height: 5\' 7"  (1.702 m)   Body mass index is 36.02 kg/m.  No results found.   Independent interpretation of notes and tests performed by another provider:   None  Procedures performed:   None  Pertinent History, Exam, Impression, and Recommendations:   Sean Freeman was seen today for hand numbness.  Radiculitis of left cervical region Assessment & Plan: Acute on chronic concern primarily with cervicothoracic tightness for years being treated by chiropractic care. After an adjustment session recently, began to note progressively worsening symptoms with repeated adjustments with numbness into the radial upper arm on left to the first 3 digits. No weakness.  Exam with positive left Spurlings, paraspinal tightness, sensorimotor intact, ROM full, painful  He has evidence of cervical radiculopathy  Plan: - Obtain x-rays - Hold from further chiropractor treatments until further evaluation completed - Start Celebrex scheduled, gabapentin nightly, and baclofen as-needed - Can use prednisone course if symptoms fail to show response in a few days - Contact for issues - Follow-up as scheduled  Orders: -     DG Cervical Spine Complete; Future -     DG Thoracic Spine W/Swimmers; Future  Other orders -     Celecoxib; Take 1 capsule (100 mg total) by mouth 2 (two) times daily.  Dispense: 30 capsule; Refill: 0 -     Gabapentin; Take 1 capsule (100 mg total) by mouth at bedtime.  Dispense: 14 capsule; Refill: 0 -      predniSONE; Take 1 tablet (50 mg total) by mouth daily.  Dispense: 5 tablet; Refill: 0 -     Baclofen; Take 0.5-1 tablets (10-20 mg total) by mouth 3 (three) times daily as needed for muscle spasms.  Dispense: 30 each; Refill: 1     Orders & Medications Meds ordered this encounter  Medications   celecoxib (CELEBREX) 100 MG capsule    Sig: Take 1 capsule (100 mg total) by mouth 2 (two) times daily.    Dispense:  30 capsule    Refill:  0   gabapentin (NEURONTIN) 100 MG capsule    Sig: Take 1 capsule (100 mg total) by mouth at bedtime.    Dispense:  14 capsule    Refill:  0   predniSONE (DELTASONE) 50 MG tablet    Sig: Take 1 tablet (50 mg total) by mouth daily.    Dispense:  5 tablet    Refill:  0   baclofen (LIORESAL) 20 MG tablet    Sig: Take 0.5-1 tablets (10-20 mg total) by mouth 3 (three) times daily as needed for muscle spasms.    Dispense:  30 each    Refill:  1   Orders Placed This Encounter  Procedures   DG Cervical Spine Complete   DG Thoracic Spine W/Swimmers     No follow-ups on file.     Sean Banana, MD, [facility]   Primary Care Sports Medicine Primary Care and  Sports Medicine at [employer]

## 2023-09-07 ENCOUNTER — Ambulatory Visit (INDEPENDENT_AMBULATORY_CARE_PROVIDER_SITE_OTHER): Payer: 59 | Admitting: Family Medicine

## 2023-09-07 ENCOUNTER — Encounter: Payer: Self-pay | Admitting: Family Medicine

## 2023-09-07 VITALS — BP 122/84 | HR 72 | Ht 67.0 in | Wt 228.0 lb

## 2023-09-07 DIAGNOSIS — M4727 Other spondylosis with radiculopathy, lumbosacral region: Secondary | ICD-10-CM

## 2023-09-07 DIAGNOSIS — M5412 Radiculopathy, cervical region: Secondary | ICD-10-CM | POA: Diagnosis not present

## 2023-09-07 MED ORDER — CELECOXIB 100 MG PO CAPS: 100.0000 mg | ORAL_CAPSULE | Freq: Two times a day (BID) | ORAL | 0 refills | Status: DC | PRN

## 2023-09-07 NOTE — Progress Notes (Signed)
Primary Care / Sports Medicine Office Visit  Patient Information:  Patient ID: JAMAURI MORAND, male DOB: Dec 30, 1985 Age: 37 y.o. MRN: [id number]   NICKOLAS DEBSKI is a pleasant 37 y.o. male presenting with the following:  Chief Complaint  Patient presents with   Radiculitis of left cervical region    Numbness is getting better    Vitals:   09/07/23 0856  BP: 122/84  Pulse: 72  SpO2: 99%   Vitals:   09/07/23 0856  Weight: 228 lb (103.4 kg)  Height: 5\' 7"  (1.702 m)   Body mass index is 35.71 kg/m.  DG Thoracic Spine W/Swimmers  Result Date: 08/23/2023 CLINICAL DATA:  Upper back pain EXAM: THORACIC SPINE - 3 VIEWS COMPARISON:  None Available. FINDINGS: There is no evidence of thoracic spine fracture. Alignment is normal. No other significant bone abnormalities are identified. IMPRESSION: Negative. Electronically Signed   By: Layla Maw M.D.   On: 08/23/2023 13:05   DG Cervical Spine Complete  Result Date: 08/23/2023 CLINICAL DATA:  Neck pain. EXAM: CERVICAL SPINE - COMPLETE 6 VIEW COMPARISON:  None Available. FINDINGS: Mild anterior wedge compression of C6 is probably chronic with a lack of associated prevertebral soft tissue swelling, but should be considered age-indeterminate. If there is concern for an acute C-spine compression fracture, consider MRI. Osseous structures are otherwise intact. Prevertebral and cervicocranial soft tissues unremarkable. Normal alignment. No osteolytic or osteoblastic changes. IMPRESSION: Age-indeterminate mild anterior wedge compression deformity C6 without evidence of associated findings to suggest an acute injury. This can be correlated clinically or evaluated further with MRI, if indicated. Otherwise unremarkable study. Electronically Signed   By: Layla Maw M.D.   On: 08/23/2023 13:04     Independent interpretation of notes and tests performed by another provider:   Independent interpretation of cervical spine x-rays dated 08/23/2023  demonstrates degenerative changes at the C5-C6 interface with wedge deformation of C6, no apparent acute osseous processes identified  Procedures performed:   None  Pertinent History, Exam, Impression, and Recommendations:   Problem List Items Addressed This Visit       Nervous and Auditory   Radiculitis of left cervical region - Primary    Recent x-rays demonstrate focal C5-6 degenerative changes which can account for his clinical features.  He has tolerated medications well, dose of Celebrex consistently, more sporadic dosing of gabapentin and baclofen due to drowsiness.  Has noted improvement in paresthesias but they have not resolved.  Examination with diminished sensation left first and second digits and into the left forearm, strength 5/5 bilateral upper extremities, equivocal Spurling's, continued paraspinal tightness.  Plan is consistent with focal C5-6 degenerative changes with radicular features left greater than right in the same distribution, continues to be symptomatic.  Plan: - MRI cervical spine without contrast ordered to further evaluate the C5-6 6 level given his persistent symptoms - Can transition to as needed dosing of Celebrex - Referral to physical therapy placed for further interim optimization - Will await MRI results and correlate with response to PT and medications - Pending the above, can determine the need for further evaluation by interventional spine      Relevant Medications   celecoxib (CELEBREX) 100 MG capsule   Other Relevant Orders   MR Cervical Spine Wo Contrast   Ambulatory referral to Physical Therapy   Lumbosacral spondylosis with radiculopathy    Has noted acute on chronic recurrence of primarily right leg paresthesias with additional low back pain.  Does have  radiographic evidence of L5-S1 intervertebral and foraminal narrowing.  He does have positive seated straight leg raise, sensorimotor grossly intact, denies any bowel/bladder  concerns.  Plan: - MRI lumbar spine given the findings today and chronicity of symptoms - Can dose Celebrex as needed, additionally gabapentin and baclofen dosing guidelines reviewed - Will schedule formal physical therapy - Will return for annual physical where we can follow-up on this issue - Recalcitrant symptoms to be addressed with consideration of referral to interventional spine group      Relevant Medications   celecoxib (CELEBREX) 100 MG capsule   Other Relevant Orders   MR Lumbar Spine Wo Contrast   Ambulatory referral to Physical Therapy     Orders & Medications Medications:  Meds ordered this encounter  Medications   celecoxib (CELEBREX) 100 MG capsule    Sig: Take 1 capsule (100 mg total) by mouth 2 (two) times daily as needed.    Dispense:  60 capsule    Refill:  0   Orders Placed This Encounter  Procedures   MR Cervical Spine Wo Contrast   MR Lumbar Spine Wo Contrast   Ambulatory referral to Physical Therapy     No follow-ups on file.     Jerrol Banana, MD, [facility]   Primary Care Sports Medicine Primary Care and Sports Medicine at [facility]

## 2023-09-07 NOTE — Assessment & Plan Note (Signed)
Recent x-rays demonstrate focal C5-6 degenerative changes which can account for his clinical features.  He has tolerated medications well, dose of Celebrex consistently, more sporadic dosing of gabapentin and baclofen due to drowsiness.  Has noted improvement in paresthesias but they have not resolved.  Examination with diminished sensation left first and second digits and into the left forearm, strength 5/5 bilateral upper extremities, equivocal Spurling's, continued paraspinal tightness.  Plan is consistent with focal C5-6 degenerative changes with radicular features left greater than right in the same distribution, continues to be symptomatic.  Plan: - MRI cervical spine without contrast ordered to further evaluate the C5-6 6 level given his persistent symptoms - Can transition to as needed dosing of Celebrex - Referral to physical therapy placed for further interim optimization - Will await MRI results and correlate with response to PT and medications - Pending the above, can determine the need for further evaluation by interventional spine

## 2023-09-07 NOTE — Patient Instructions (Addendum)
-   Coordinator will contact you to schedule MRI and physical therapy - Can transition to as needed dosing of Celebrex for pain (take with food) - Use gabapentin as needed for neuropathic pain (numbness, tingling) - Use baclofen as needed for muscle tightness pain - Return for annual physical but we will follow-up on the above issues

## 2023-09-07 NOTE — Assessment & Plan Note (Signed)
Has noted acute on chronic recurrence of primarily right leg paresthesias with additional low back pain.  Does have radiographic evidence of L5-S1 intervertebral and foraminal narrowing.  He does have positive seated straight leg raise, sensorimotor grossly intact, denies any bowel/bladder concerns.  Plan: - MRI lumbar spine given the findings today and chronicity of symptoms - Can dose Celebrex as needed, additionally gabapentin and baclofen dosing guidelines reviewed - Will schedule formal physical therapy - Will return for annual physical where we can follow-up on this issue - Recalcitrant symptoms to be addressed with consideration of referral to interventional spine group

## 2023-09-11 ENCOUNTER — Telehealth: Payer: Self-pay | Admitting: Family Medicine

## 2023-09-11 NOTE — Telephone Encounter (Signed)
Denial printed and in Dr. Ashley Royalty box.  KP

## 2023-09-11 NOTE — Telephone Encounter (Signed)
Copied from CRM [id number]. Topic: General - Other >> Sep 11, 2023 11:19 AM Turkey B wrote: Reason for CRM: pt called in states mri requested for his neck and bag was denied, not sure why. Please cb

## 2023-09-15 ENCOUNTER — Telehealth: Payer: Self-pay | Admitting: Family Medicine

## 2023-09-15 NOTE — Telephone Encounter (Signed)
Pt aware.

## 2023-09-15 NOTE — Telephone Encounter (Signed)
PC to pt, insurance had called him Friday, he understands the PT for 6 weeks, pt states he is getting better will see if he needs PT when they call for appointment advised pt it could be a couple months before they call. Pt voiced understanding.

## 2023-09-15 NOTE — Telephone Encounter (Signed)
Copied from CRM [id number]. Topic: General - Other >> Sep 15, 2023 12:08 PM Franchot Heidelberg wrote: Reason for CRM: Irving Burton nurse case manager at [facility] Denial for MRI of neck and lumber spine Please have provider contact peer to peer line: [phone]  [phone] ext. [phone]

## 2023-09-16 NOTE — Telephone Encounter (Signed)
FYI  KP

## 2023-10-20 ENCOUNTER — Encounter: Payer: Self-pay | Admitting: Family Medicine

## 2023-10-20 ENCOUNTER — Ambulatory Visit (INDEPENDENT_AMBULATORY_CARE_PROVIDER_SITE_OTHER): Payer: 59 | Admitting: Family Medicine

## 2023-10-20 VITALS — BP 122/86 | HR 89 | Ht 67.0 in | Wt 227.0 lb

## 2023-10-20 DIAGNOSIS — J00 Acute nasopharyngitis [common cold]: Secondary | ICD-10-CM | POA: Insufficient documentation

## 2023-10-20 DIAGNOSIS — M5412 Radiculopathy, cervical region: Secondary | ICD-10-CM

## 2023-10-20 LAB — POCT RAPID STREP A (OFFICE): Rapid Strep A Screen: NEGATIVE

## 2023-10-20 LAB — POC COVID19 BINAXNOW: SARS Coronavirus 2 Ag: NEGATIVE

## 2023-10-20 LAB — POCT INFLUENZA A/B
Influenza A, POC: NEGATIVE
Influenza B, POC: NEGATIVE

## 2023-10-20 MED ORDER — BACLOFEN 20 MG PO TABS: 10.0000 mg | ORAL_TABLET | Freq: Three times a day (TID) | ORAL | 1 refills | Status: DC | PRN

## 2023-10-20 MED ORDER — AZITHROMYCIN 250 MG PO TABS: ORAL_TABLET | ORAL | 0 refills | Status: AC

## 2023-10-20 NOTE — Assessment & Plan Note (Signed)
Ongoing symptoms, has noted improvement and transitioned to PRN medications. Has not heard from PT and MRI was not covered citing lack of PT.   - Continue baclofen as-needed - Contact us with [place] physical therapy group so that we can send a new referral

## 2023-10-20 NOTE — Assessment & Plan Note (Signed)
2 day history of severe sinus and nasopharyngeal pain, child with +Strep 1.5 weeks prior, afebrile. Exam with marked erythema in the nasopharynx and oropharynx, apparent exudate, tender LAD, tenderness to bilateral maxillary sinuses. POC testing reviewed.  Patient with acute nasopharyngitis, will empirically treat.  Plan: - Dose azithromycin for full course - Use Mucinex  - Can [place] ibuprofen OR Celebrex for pain

## 2023-10-20 NOTE — Progress Notes (Signed)
     Primary Care / Sports Medicine Office Visit  Patient Information:  Patient ID: Sean Freeman, male DOB: [birth year] Age: 37 y.o. MRN: [id number]   [name] is a pleasant 37 y.o. male presenting with the following:  Chief Complaint  Patient presents with   Sore Throat    X2 days, congestions, Salvetti mucous, headache, has tried ibuprofen, child had strep 1.5 weeks ago, no covid test, tender to swollen, swollen throat, no fever     Vitals:   10/20/23 0820  BP: 122/86  Pulse: 89  SpO2: 98%   Vitals:   10/20/23 0820  Weight: 227 lb (103 kg)  Height: 5\' 7"  (1.702 m)   Body mass index is 35.55 kg/m.  No results found.   Independent interpretation of notes and tests performed by another provider:   None  Procedures performed:   None  Pertinent History, Exam, Impression, and Recommendations:   Problem List Items Addressed This Visit       Respiratory   Nasopharyngitis acute - Primary    2 day history of severe sinus and nasopharyngeal pain, child with +Strep 1.5 weeks prior, afebrile. Exam with marked erythema in the nasopharynx and oropharynx, apparent exudate, tender LAD, tenderness to bilateral maxillary sinuses. POC testing reviewed.  Patient with acute nasopharyngitis, will empirically treat.  Plan: - Dose azithromycin for full course - Use Mucinex  - Can [place] ibuprofen OR Celebrex for pain       Relevant Medications   azithromycin (ZITHROMAX) 250 MG tablet   Other Relevant Orders   POC COVID-19 (Completed)   POCT rapid strep A (Completed)   POCT Influenza A/B (Completed)     Nervous and Auditory   Radiculitis of left cervical region    Ongoing symptoms, has noted improvement and transitioned to PRN medications. Has not heard from PT and MRI was not covered citing lack of PT.   - Continue baclofen as-needed - Contact us with [facility] physical therapy group so that we can send a new referral      Relevant Medications   baclofen (LIORESAL) 20  MG tablet     Orders & Medications Medications:  Meds ordered this encounter  Medications   baclofen (LIORESAL) 20 MG tablet    Sig: Take 0.5-1 tablets (10-20 mg total) by mouth 3 (three) times daily as needed for muscle spasms.    Dispense:  30 each    Refill:  1   azithromycin (ZITHROMAX) 250 MG tablet    Sig: Take 2 tablets on day 1, then 1 tablet daily on days 2 through 5    Dispense:  6 tablet    Refill:  0   Orders Placed This Encounter  Procedures   POC COVID-19   POCT rapid strep A   POCT Influenza A/B     No follow-ups on file.     Jerrol Banana, MD, [facility]   Primary Care Sports Medicine Primary Care and Sports Medicine at [facility]

## 2023-10-20 NOTE — Patient Instructions (Signed)
-   Dose azithromycin for full course - Use Mucinex  - Can [place] ibuprofen OR Celebrex for pain  - Continue baclofen as-needed - Contact us with [place] physical therapy group so that we can send a new referral

## 2023-11-09 ENCOUNTER — Encounter: Payer: Self-pay | Admitting: Family Medicine

## 2023-11-09 ENCOUNTER — Ambulatory Visit (INDEPENDENT_AMBULATORY_CARE_PROVIDER_SITE_OTHER): Payer: 59 | Admitting: Family Medicine

## 2023-11-09 VITALS — BP 128/74 | HR 74 | Temp 98.2°F | Ht 67.0 in | Wt 225.0 lb

## 2023-11-09 DIAGNOSIS — M5412 Radiculopathy, cervical region: Secondary | ICD-10-CM

## 2023-11-09 DIAGNOSIS — E782 Mixed hyperlipidemia: Secondary | ICD-10-CM

## 2023-11-09 DIAGNOSIS — Z0000 Encounter for general adult medical examination without abnormal findings: Secondary | ICD-10-CM | POA: Diagnosis not present

## 2023-11-09 DIAGNOSIS — Z131 Encounter for screening for diabetes mellitus: Secondary | ICD-10-CM

## 2023-11-09 DIAGNOSIS — L729 Follicular cyst of the skin and subcutaneous tissue, unspecified: Secondary | ICD-10-CM

## 2023-11-09 DIAGNOSIS — M4727 Other spondylosis with radiculopathy, lumbosacral region: Secondary | ICD-10-CM | POA: Diagnosis not present

## 2023-11-09 DIAGNOSIS — E559 Vitamin D deficiency, unspecified: Secondary | ICD-10-CM

## 2023-11-09 DIAGNOSIS — Z1283 Encounter for screening for malignant neoplasm of skin: Secondary | ICD-10-CM

## 2023-11-09 DIAGNOSIS — Z8249 Family history of ischemic heart disease and other diseases of the circulatory system: Secondary | ICD-10-CM | POA: Diagnosis not present

## 2023-11-09 MED ORDER — GABAPENTIN 100 MG PO CAPS: 100.0000 mg | ORAL_CAPSULE | Freq: Every day | ORAL | 0 refills | Status: DC

## 2023-11-09 MED ORDER — BACLOFEN 20 MG PO TABS: 20.0000 mg | ORAL_TABLET | Freq: Three times a day (TID) | ORAL | 0 refills | Status: DC | PRN

## 2023-11-09 NOTE — Assessment & Plan Note (Signed)
Also mentions having cysts on the head and a raised mole, which occasionally cause discomfort. Interested in seeing dermatology for definitive management and skin screening.  Dermatological Concerns Patient reports cysts on scalp and raised mole. -Refer to dermatology for evaluation and possible removal of cysts. -Recommend full skin check during dermatology visit.

## 2023-11-09 NOTE — Assessment & Plan Note (Signed)
Annual examination completed, risk stratification labs ordered, anticipatory guidance provided.  We will follow labs once resulted. 

## 2023-11-09 NOTE — Assessment & Plan Note (Addendum)
History of Present Illness The patient, with a history of neck and shoulder pain, presents with ongoing symptoms. The pain, which radiates down the arm, has been stable and reports essential resolution. The patient also experiences a jolt-like sensation radiating down to the feet bilaterally when tilting and flexing the neck. The patient has been managing these symptoms with gabapentin PRN and baclofen 20 mg, which they report to be effective.  Cervical and Lumbosacral Radiculopathy Resolved numbness and tingling in left arm. Persistent neck pain and chronic bilateral lower extremity symptoms with neck flexion. Patient is awaiting physical therapy appointment with [facility]. -Continue Gabapentin 100mg  at bedtime, with option to increase to 2-3 capsules if needed (100-300 mg nightly as needed). -Continue Baclofen 20mg  as needed, up to three times a day. -Prior MRI ordered, insurance not covered, pursue again if symptoms persist after physical therapy.

## 2023-11-09 NOTE — Assessment & Plan Note (Signed)
See additional assessment(s) for plan details. 

## 2023-11-09 NOTE — Progress Notes (Signed)
Annual Physical Exam Visit  Patient Information:  Patient ID: Sean Freeman, male DOB: Sep 11, 1986 Age: 37 y.o. MRN: [id number]   Subjective:   CC: Annual Physical Exam  HPI:  Sean Freeman is here for their annual physical.  I reviewed the past medical history, family history, social history, surgical history, and allergies today and changes were made as necessary.  Please see the problem list section below for additional details.  Past Medical History: Past Medical History:  Diagnosis Date   Allergy    GERD (gastroesophageal reflux disease)    Hyperlipidemia    Past Surgical History: Past Surgical History:  Procedure Laterality Date   TYMPANOSTOMY TUBE PLACEMENT Bilateral    Family History: Family History  Problem Relation Age of Onset   Diabetes Mother    Allergies Brother    Diabetes Paternal Uncle    Heart attack Paternal Uncle    Diabetes Paternal Uncle    Heart attack Paternal Uncle    Diabetes Paternal Uncle    Heart attack Paternal Uncle    Diabetes Maternal Grandmother    Heart attack Maternal Grandmother    Diabetes Maternal Grandfather    Heart attack Maternal Grandfather    Allergies: Allergies  Allergen Reactions   Amoxicillin Hives   Penicillins Hives   Health Maintenance: Health Maintenance  Topic Date Due   DTaP/Tdap/Td (2 - Td or Tdap) 02/14/2032   Hepatitis C Screening  Completed   HIV Screening  Completed   HPV VACCINES  Aged Out   INFLUENZA VACCINE  Discontinued   COVID-19 Vaccine  Discontinued    HM Colonoscopy     This patient has no relevant Health Maintenance data.      Medications: Current Outpatient Medications on File Prior to Visit  Medication Sig Dispense Refill   celecoxib (CELEBREX) 100 MG capsule Take 1 capsule (100 mg total) by mouth 2 (two) times daily as needed. 60 capsule 0   No current facility-administered medications on file prior to visit.    Objective:   Vitals:   11/09/23 0900  BP: 128/74   Pulse: 74  Temp: 98.2 F (36.8 C)  SpO2: 98%   Vitals:   11/09/23 0900  Weight: 225 lb (102.1 kg)  Height: 5\' 7"  (1.702 m)   Body mass index is 35.24 kg/m.  General: Well Developed, well nourished, and in no acute distress.  Neuro: Alert and oriented x3, extra-ocular muscles intact, sensation grossly intact. Cranial nerves II through XII are grossly intact, motor, sensory, and coordinative functions are intact. HEENT: Normocephalic, atraumatic, neck supple, no masses, no lymphadenopathy, thyroid nonenlarged. Oropharynx, nasopharynx, external ear canals are unremarkable. Skin: Warm and dry, no rashes noted.  Cardiac: Regular rate and rhythm, no murmurs rubs or gallops. No peripheral edema. Pulses symmetric. Respiratory: Clear to auscultation bilaterally. Speaking in full sentences.  Abdominal: Soft, nontender, nondistended, positive bowel sounds, no masses, no organomegaly. Musculoskeletal: Stable, and with full range of motion.   Impression and Recommendations:   The patient was counselled, risk factors were discussed, and anticipatory guidance given.  Problem List Items Addressed This Visit       Nervous and Auditory   Radiculitis of left cervical region    History of Present Illness The patient, with a history of neck and shoulder pain, presents with ongoing symptoms. The pain, which radiates down the arm, has been stable and reports essential resolution. The patient also experiences a jolt-like sensation radiating down to the feet bilaterally when tilting  and flexing the neck. The patient has been managing these symptoms with gabapentin PRN and baclofen 20 mg, which they report to be effective.  Cervical and Lumbosacral Radiculopathy Resolved numbness and tingling in left arm. Persistent neck pain and chronic bilateral lower extremity symptoms with neck flexion. Patient is awaiting physical therapy appointment with [facility]. -Continue Gabapentin 100mg  at bedtime, with  option to increase to 2-3 capsules if needed (100-300 mg nightly as needed). -Continue Baclofen 20mg  as needed, up to three times a day. -Prior MRI ordered, insurance not covered, pursue again if symptoms persist after physical therapy.      Relevant Medications   gabapentin (NEURONTIN) 100 MG capsule   baclofen (LIORESAL) 20 MG tablet   Lumbosacral spondylosis with radiculopathy    See additional assessment(s) for plan details.      Relevant Medications   gabapentin (NEURONTIN) 100 MG capsule   baclofen (LIORESAL) 20 MG tablet     Musculoskeletal and Integument   Scalp cyst    Also mentions having cysts on the head and a raised mole, which occasionally cause discomfort. Interested in seeing dermatology for definitive management and skin screening.  Dermatological Concerns Patient reports cysts on scalp and raised mole. -Refer to dermatology for evaluation and possible removal of cysts. -Recommend full skin check during dermatology visit.      Relevant Orders   Ambulatory referral to Dermatology     Other   Mixed hyperlipidemia    Hyperlipidemia Patient discontinued statin therapy due to possible side effects (skin rash, this did not resolve after statin discontinuation, in the setting of detergent change).  Strong family history of heart disease and diabetes. -Order lipid panel to assess current cholesterol levels. -Strongly consider resuming statin therapy based on lab results and patient's family history.      Relevant Orders   Comprehensive metabolic panel   Apo A1 + B + Ratio   Lipid panel   Healthcare maintenance - Primary    Annual examination completed, risk stratification labs ordered, anticipatory guidance provided.  We will follow labs once resulted.      Relevant Orders   CBC   Comprehensive metabolic panel   Apo A1 + B + Ratio   Hemoglobin A1c   Lipid panel   TSH   VITAMIN D 25 Hydroxy (Vit-D Deficiency, Fractures)   Family history of premature  coronary heart disease   Relevant Orders   Comprehensive metabolic panel   Apo A1 + B + Ratio   Lipid panel   Other Visit Diagnoses     Skin cancer screening       Relevant Orders   Ambulatory referral to Dermatology   Vitamin D deficiency       Relevant Orders   VITAMIN D 25 Hydroxy (Vit-D Deficiency, Fractures)   Screening for diabetes mellitus       Relevant Orders   Comprehensive metabolic panel   Hemoglobin A1c        Orders & Medications Medications:  Meds ordered this encounter  Medications   gabapentin (NEURONTIN) 100 MG capsule    Sig: Take 1-3 capsules (100-300 mg total) by mouth at bedtime.    Dispense:  90 capsule    Refill:  0   baclofen (LIORESAL) 20 MG tablet    Sig: Take 1 tablet (20 mg total) by mouth 3 (three) times daily as needed for muscle spasms.    Dispense:  90 each    Refill:  0   Orders Placed This Encounter  Procedures   CBC   Comprehensive metabolic panel   Apo A1 + B + Ratio   Hemoglobin A1c   Lipid panel   TSH   VITAMIN D 25 Hydroxy (Vit-D Deficiency, Fractures)   Ambulatory referral to Dermatology     Return in about 1 year (around 11/08/2024) for CPE.    Jerrol Banana, MD, [facility]   Primary Care Sports Medicine Primary Care and Sports Medicine at [facility]

## 2023-11-09 NOTE — Patient Instructions (Addendum)
-   Obtain fasting labs with orders provided (can have water or black coffee but otherwise no food or drink x 8 hours before labs) - Review information provided - Attend eye doctor annually, dentist every 6 months, work towards or maintain 30 minutes of moderate intensity physical activity at least 5 days per week, and consume a balanced diet - Return in 1 year for physical - Contact us for any questions between now and then  Additionally:  -CERVICAL RADICULOPATHY: -Continue taking Gabapentin 100mg  at bedtime, with the option to increase to 2-3 capsules if needed -Baclofen 20mg  as needed, up to three times a day.  -Pursue MRI if your symptoms persist after physical therapy.  -HYPERLIPIDEMIA: Hyperlipidemia is a condition where there are high levels of fats (lipids) in the blood, which can increase the risk of heart disease.  -We will order a lipid panel to assess your current cholesterol levels and consider resuming statin therapy based on the results given your family history.  -DERMATOLOGICAL CONCERNS:  -Refer you to dermatology for evaluation and possible removal of the cysts, and recommend a full skin check during your visit.

## 2023-11-09 NOTE — Assessment & Plan Note (Signed)
Hyperlipidemia Patient discontinued statin therapy due to possible side effects (skin rash, this did not resolve after statin discontinuation, in the setting of detergent change).  Strong family history of heart disease and diabetes. -Order lipid panel to assess current cholesterol levels. -Strongly consider resuming statin therapy based on lab results and patient's family history.

## 2023-11-24 LAB — HEMOGLOBIN A1C
Est. average glucose Bld gHb Est-mCnc: 108 mg/dL
Hgb A1c MFr Bld: 5.4 % (ref 4.8–5.6)

## 2023-11-24 LAB — CBC
Hematocrit: 47.1 % (ref 37.5–51.0)
Hemoglobin: 15.5 g/dL (ref 13.0–17.7)
MCH: 28.3 pg (ref 26.6–33.0)
MCHC: 32.9 g/dL (ref 31.5–35.7)
MCV: 86 fL (ref 79–97)
Platelets: 284 10*3/uL (ref 150–450)
RBC: 5.48 x10E6/uL (ref 4.14–5.80)
RDW: 13.1 % (ref 11.6–15.4)
WBC: 5.2 10*3/uL (ref 3.4–10.8)

## 2023-11-24 LAB — LIPID PANEL
Chol/HDL Ratio: 6.1 {ratio} — ABNORMAL HIGH (ref 0.0–5.0)
Cholesterol, Total: 208 mg/dL — ABNORMAL HIGH (ref 100–199)
HDL: 34 mg/dL — ABNORMAL LOW (ref >39)
LDL Chol Calc (NIH): 131 mg/dL — ABNORMAL HIGH (ref 0–99)
Triglycerides: 241 mg/dL — ABNORMAL HIGH (ref 0–149)
VLDL Cholesterol Cal: 43 mg/dL — ABNORMAL HIGH (ref 5–40)

## 2023-11-24 LAB — COMPREHENSIVE METABOLIC PANEL
ALT: 53 [IU]/L — ABNORMAL HIGH (ref 0–44)
AST: 33 [IU]/L (ref 0–40)
Albumin: 4.7 g/dL (ref 4.1–5.1)
Alkaline Phosphatase: 122 [IU]/L — ABNORMAL HIGH (ref 44–121)
BUN/Creatinine Ratio: 15 (ref 9–20)
BUN: 17 mg/dL (ref 6–20)
Bilirubin Total: 0.5 mg/dL (ref 0.0–1.2)
CO2: 22 mmol/L (ref 20–29)
Calcium: 10.1 mg/dL (ref 8.7–10.2)
Chloride: 102 mmol/L (ref 96–106)
Creatinine, Ser: 1.16 mg/dL (ref 0.76–1.27)
Globulin, Total: 2.7 g/dL (ref 1.5–4.5)
Glucose: 103 mg/dL — ABNORMAL HIGH (ref 70–99)
Potassium: 4.7 mmol/L (ref 3.5–5.2)
Sodium: 139 mmol/L (ref 134–144)
Total Protein: 7.4 g/dL (ref 6.0–8.5)
eGFR: 83 mL/min/{1.73_m2} (ref >59)

## 2023-11-24 LAB — APO A1 + B + RATIO
Apolipo. B/A-1 Ratio: 1 {ratio} — ABNORMAL HIGH (ref 0.0–0.7)
Apolipoprotein A-1: 123 mg/dL (ref 101–178)
Apolipoprotein B: 126 mg/dL — ABNORMAL HIGH (ref <90)

## 2023-11-24 LAB — TSH: TSH: 2.4 u[IU]/mL (ref 0.450–4.500)

## 2023-11-24 LAB — VITAMIN D 25 HYDROXY (VIT D DEFICIENCY, FRACTURES): Vit D, 25-Hydroxy: 17.9 ng/mL — ABNORMAL LOW (ref 30.0–100.0)

## 2023-12-01 ENCOUNTER — Other Ambulatory Visit: Payer: Self-pay | Admitting: Family Medicine

## 2023-12-01 DIAGNOSIS — E559 Vitamin D deficiency, unspecified: Secondary | ICD-10-CM

## 2023-12-01 MED ORDER — VITAMIN D (ERGOCALCIFEROL) 1.25 MG (50000 UNIT) PO CAPS: 50000.0000 [IU] | ORAL_CAPSULE | ORAL | 0 refills | Status: DC

## 2023-12-14 ENCOUNTER — Other Ambulatory Visit: Payer: Self-pay | Admitting: Family Medicine

## 2023-12-14 DIAGNOSIS — M5412 Radiculopathy, cervical region: Secondary | ICD-10-CM

## 2023-12-15 ENCOUNTER — Other Ambulatory Visit: Payer: Self-pay | Admitting: Family Medicine

## 2023-12-15 DIAGNOSIS — E559 Vitamin D deficiency, unspecified: Secondary | ICD-10-CM

## 2023-12-15 NOTE — Telephone Encounter (Signed)
Requested Prescriptions  Pending Prescriptions Disp Refills   baclofen (LIORESAL) 20 MG tablet [Pharmacy Med Name: BACLOFEN 20 MG TABLET] 270 tablet 1    Sig: TAKE 1 TABLET (20 MG TOTAL) BY MOUTH 3 (THREE) TIMES DAILY AS NEEDED FOR MUSCLE SPASMS.     Analgesics:  Muscle Relaxants - baclofen Passed - 12/15/2023  1:47 PM      Passed - Cr in normal range and within 180 days    Creatinine, Ser  Date Value Ref Range Status  11/23/2023 1.16 0.76 - 1.27 mg/dL Final         Passed - eGFR is 30 or above and within 180 days    eGFR  Date Value Ref Range Status  11/23/2023 83 >59 mL/min/1.73 Final         Passed - Valid encounter within last 6 months    Recent Outpatient Visits           1 month ago Healthcare maintenance   [facility] at [facility], Ocie Bob, MD   1 month ago Nasopharyngitis acute   [facility] Medicine at [facility], Ocie Bob, MD   3 months ago Radiculitis of left cervical region   [facility] & [facility] Medicine at [facility], Ocie Bob, MD   3 months ago Radiculitis of left cervical region   [facility] & [facility] Medicine at [facility], Ocie Bob, MD   1 year ago Mixed hyperlipidemia   [facility] Medicine at [facility], Ocie Bob, MD       Future Appointments             In 1 month Elie Goody, MD [facility]   In 11 months Ashley Royalty, Ocie Bob, MD [facility] Primary Care & Sports Medicine at [facility], [facility]

## 2023-12-15 NOTE — Telephone Encounter (Signed)
Requested medication (s) are due for refill today - no  Requested medication (s) are on the active medication list -yes  Future visit scheduled -yes  Last refill: 12/01/23 #8  Notes to clinic: high dose medication- provider review , request may not be appropriate   Pharmacy comment: REQUEST FOR 90 DAYS PRESCRIPTION.    Requested Prescriptions  Pending Prescriptions Disp Refills   Vitamin D, Ergocalciferol, (DRISDOL) 1.25 MG (50000 UNIT) CAPS capsule [Pharmacy Med Name: VITAMIN D2 1.25MG (50,000 UNIT)] 12 capsule 1    Sig: Take 1 capsule (50,000 Units total) by mouth every 7 (seven) days. Take for 8 total doses(weeks)     Endocrinology:  Vitamins - Vitamin D Supplementation 2 Failed - 12/15/2023  4:23 PM      Failed - Manual Review: Route requests for 50,000 IU strength to the provider      Failed - Vitamin D in normal range and within 360 days    Vit D, 25-Hydroxy  Date Value Ref Range Status  11/23/2023 17.9 (L) 30.0 - 100.0 ng/mL Final    Comment:    Vitamin D deficiency has been defined by the Institute of Medicine and an Endocrine Society practice guideline as a level of serum 25-OH vitamin D less than 20 ng/mL (1,2). The Endocrine Society went on to further define vitamin D insufficiency as a level between 21 and 29 ng/mL (2). 1. IOM (Institute of Medicine). 2010. Dietary reference    intakes for calcium and D. [place]: The    [employer]. 2. Holick MF, Binkley Logan, Bischoff-Ferrari HA, et al.    Evaluation, treatment, and prevention of vitamin D    deficiency: an Endocrine Society clinical practice    guideline. JCEM. 2011 Jul; 96(7):1911-30.          Passed - Ca in normal range and within 360 days    Calcium  Date Value Ref Range Status  11/23/2023 10.1 8.7 - 10.2 mg/dL Final         Passed - Valid encounter within last 12 months    Recent Outpatient Visits           1 month ago Healthcare maintenance   [facility] at [facility], Ocie Bob, MD   1 month ago Nasopharyngitis acute   [facility] at [facility], Ocie Bob, MD   3 months ago Radiculitis of left cervical region   [facility] & [facility] Medicine at [facility], Ocie Bob, MD   3 months ago Radiculitis of left cervical region   [facility] Medicine at [facility], Ocie Bob, MD   1 year ago Mixed hyperlipidemia   [facility] & Sports Medicine at [facility], Ocie Bob, MD       Future Appointments             In 1 month Elie Goody, MD [facility]   In 11 months Ashley Royalty, Ocie Bob, MD [facility] at [facility]               Requested Prescriptions  Pending Prescriptions Disp Refills   Vitamin D, Ergocalciferol, (DRISDOL) 1.25 MG (50000 UNIT) CAPS capsule [Pharmacy Med Name: VITAMIN D2 1.25MG (50,000 UNIT)] 12 capsule 1    Sig: Take 1 capsule (50,000 Units total) by mouth every 7 (seven) days. Take for  8 total doses(weeks)     Endocrinology:  Vitamins - Vitamin D Supplementation 2 Failed - 12/15/2023  4:23 PM      Failed - Manual Review: Route requests for 50,000 IU strength to the provider      Failed - Vitamin D in normal range and within 360 days    Vit D, 25-Hydroxy  Date Value Ref Range Status  11/23/2023 17.9 (L) 30.0 - 100.0 ng/mL Final    Comment:    Vitamin D deficiency has been defined by the Institute of Medicine and an Endocrine Society practice guideline as a level of serum 25-OH vitamin D less than 20 ng/mL (1,2). The Endocrine Society went on to further define vitamin D insufficiency as a level between 21 and 29 ng/mL (2). 1. IOM (Institute of Medicine). 2010. Dietary reference    intakes for calcium and D. [place]: The    [employer]. 2. Holick MF, Binkley [place],  Bischoff-Ferrari HA, et al.    Evaluation, treatment, and prevention of vitamin D    deficiency: an Endocrine Society clinical practice    guideline. JCEM. 2011 Jul; 96(7):1911-30.          Passed - Ca in normal range and within 360 days    Calcium  Date Value Ref Range Status  11/23/2023 10.1 8.7 - 10.2 mg/dL Final         Passed - Valid encounter within last 12 months    Recent Outpatient Visits           1 month ago Healthcare maintenance   [facility] at [facility], Ocie Bob, MD   1 month ago Nasopharyngitis acute   [facility] Primary Care & Sports Medicine at [facility], Ocie Bob, MD   3 months ago Radiculitis of left cervical region   [facility] at [facility], Ocie Bob, MD   3 months ago Radiculitis of left cervical region   [facility] & [facility] Medicine at [facility], Ocie Bob, MD   1 year ago Mixed hyperlipidemia   [facility] Medicine at [facility], Ocie Bob, MD       Future Appointments             In 1 month Elie Goody, MD [facility]   In 11 months Ashley Royalty, Ocie Bob, MD [facility] Primary Care & Sports Medicine at [facility], [facility]

## 2023-12-30 IMAGING — CR DG KNEE COMPLETE 4+V*R*
4 series · 4 of 4 positions shown · non-contrast
Comparison: None.

CLINICAL DATA: Patellofemoral syndrome, knee pain, anterior knee
pain exacerbated by squatting

EXAM:
RIGHT KNEE - COMPLETE 4+ VIEW; LEFT KNEE - COMPLETE 4+ VIEW

[knee ap]
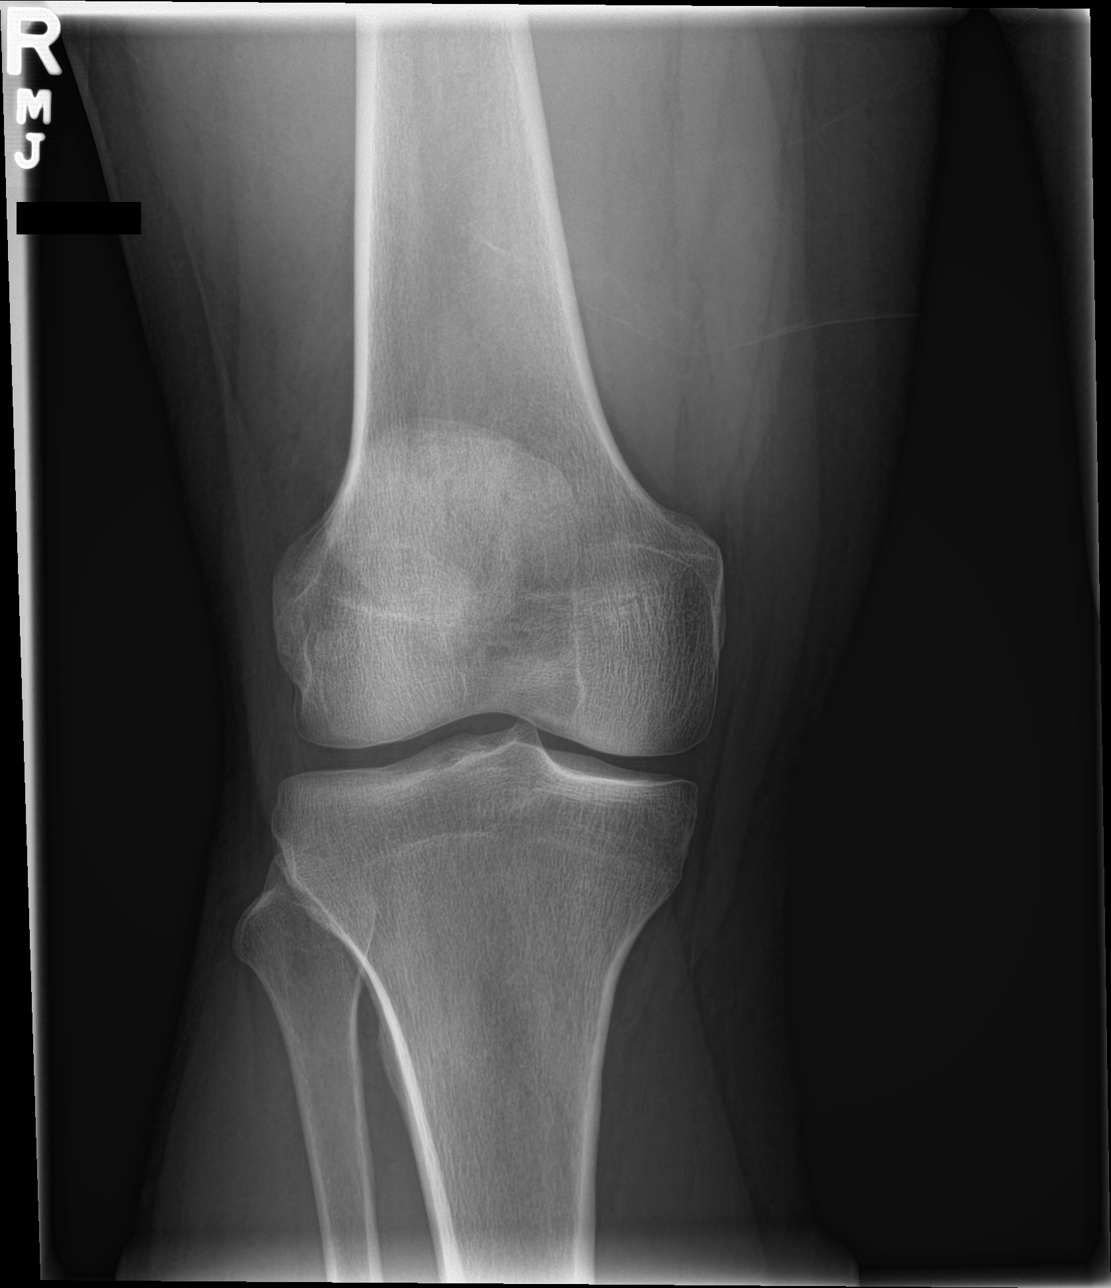

[knee lat]
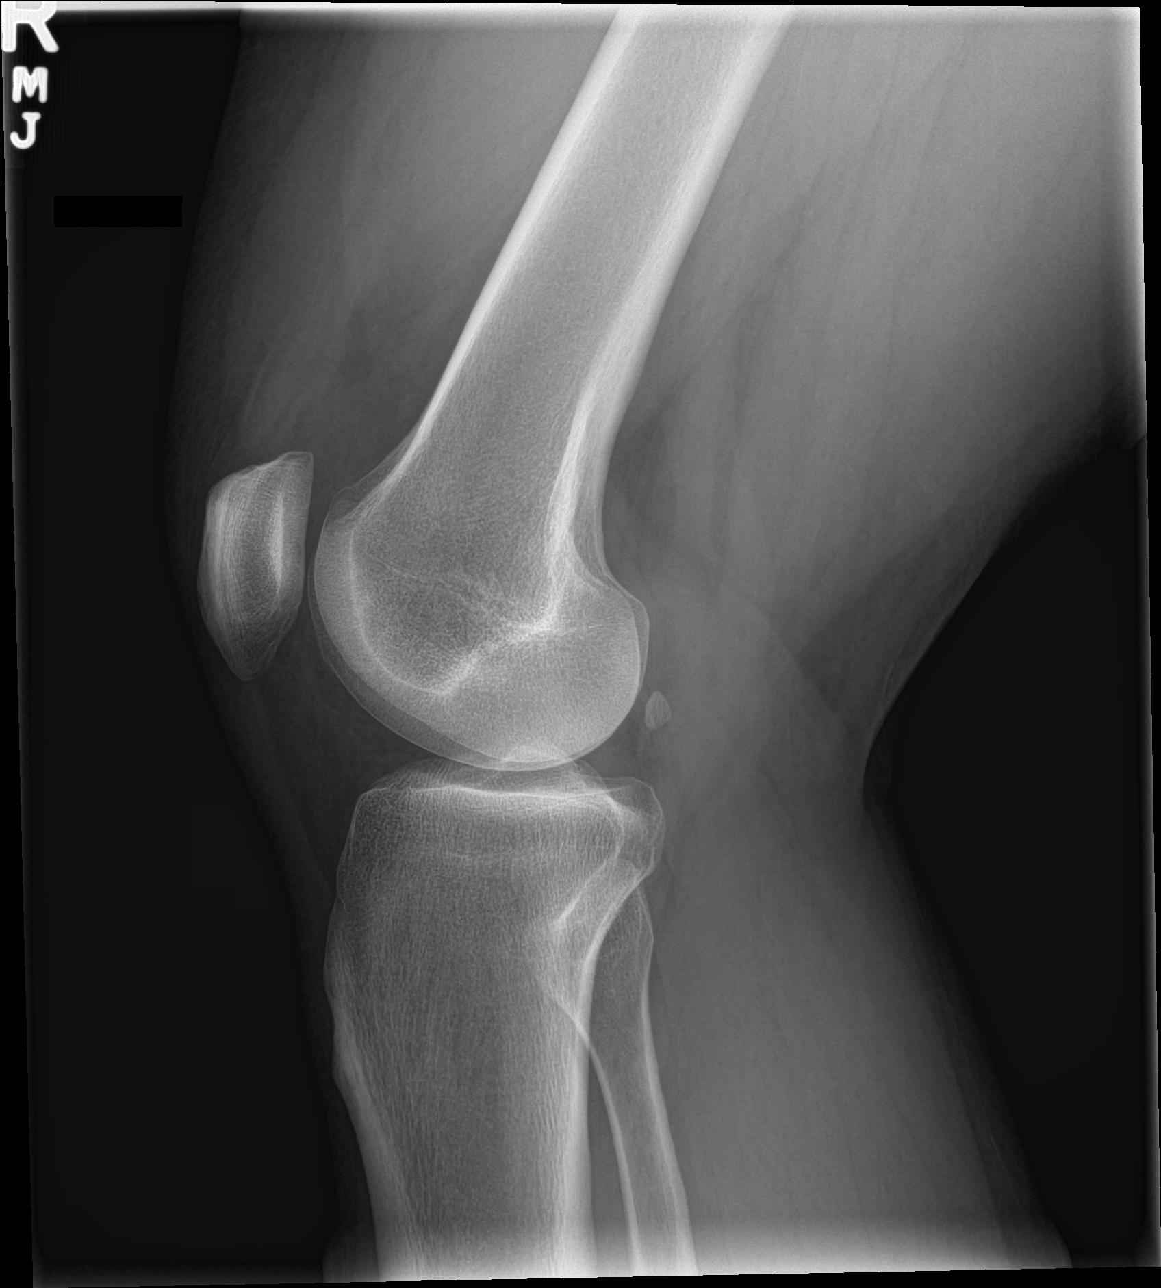

[tunnel]
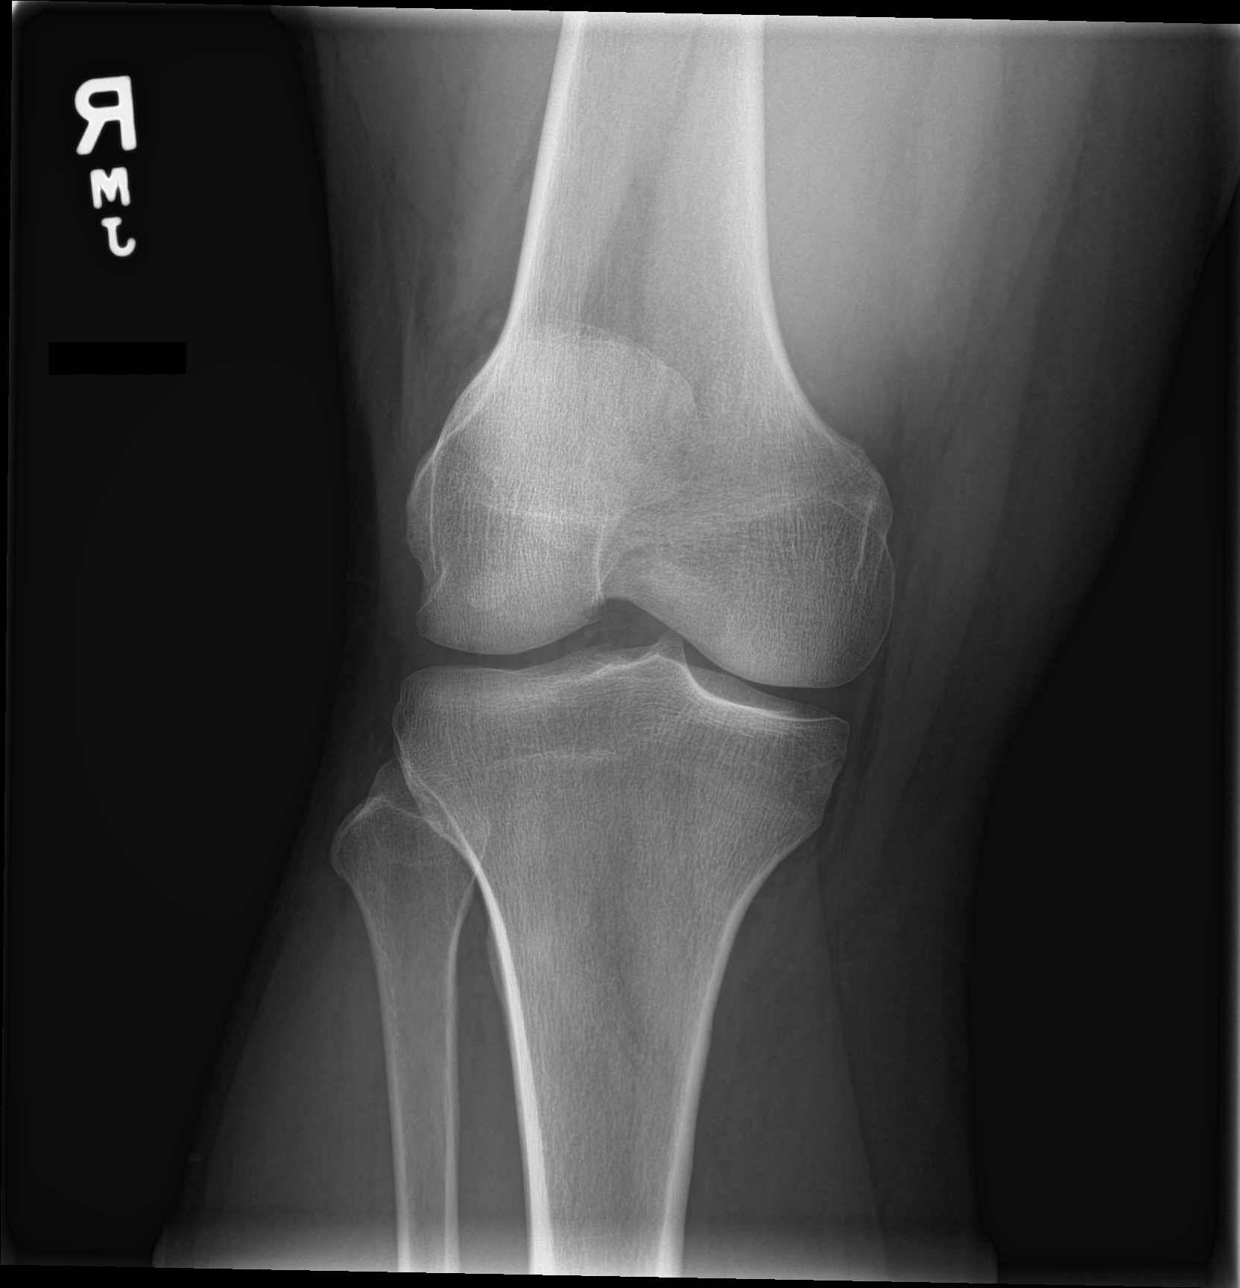

[patella skyline]
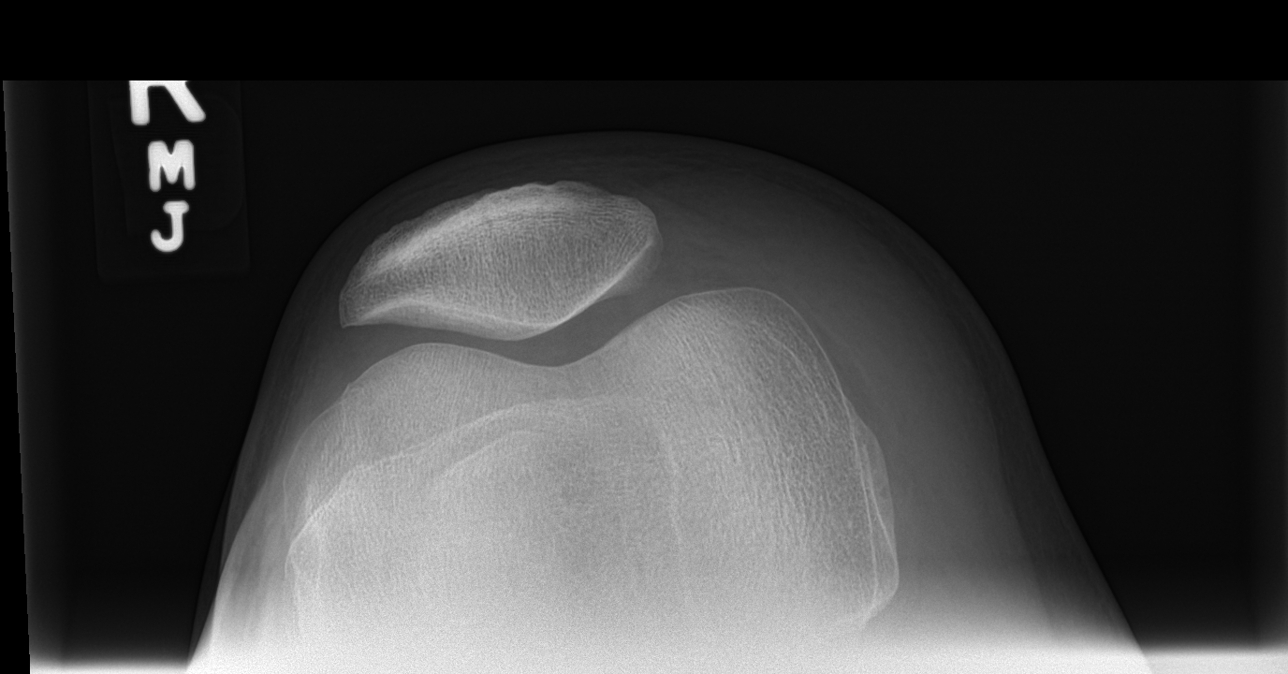

[4 of 4 positions shown; findings below may reference images not displayed]

FINDINGS: No evidence of fracture, dislocation, or joint effusion. No evidence
of arthropathy or other focal bone abnormality. Soft tissues are
unremarkable.
IMPRESSION: No fracture or dislocation of the bilateral knees. Joint spaces are
well preserved. No knee joint effusion.

## 2023-12-30 IMAGING — CR DG LUMBAR SPINE COMPLETE 4+V
5 series · 5 of 5 positions shown · non-contrast
Comparison: None.

CLINICAL DATA: Chronic lower lumbar back pain radiating into groin
and mid thigh

EXAM:
LUMBAR SPINE - COMPLETE 4+ VIEW

[l-spine ap]
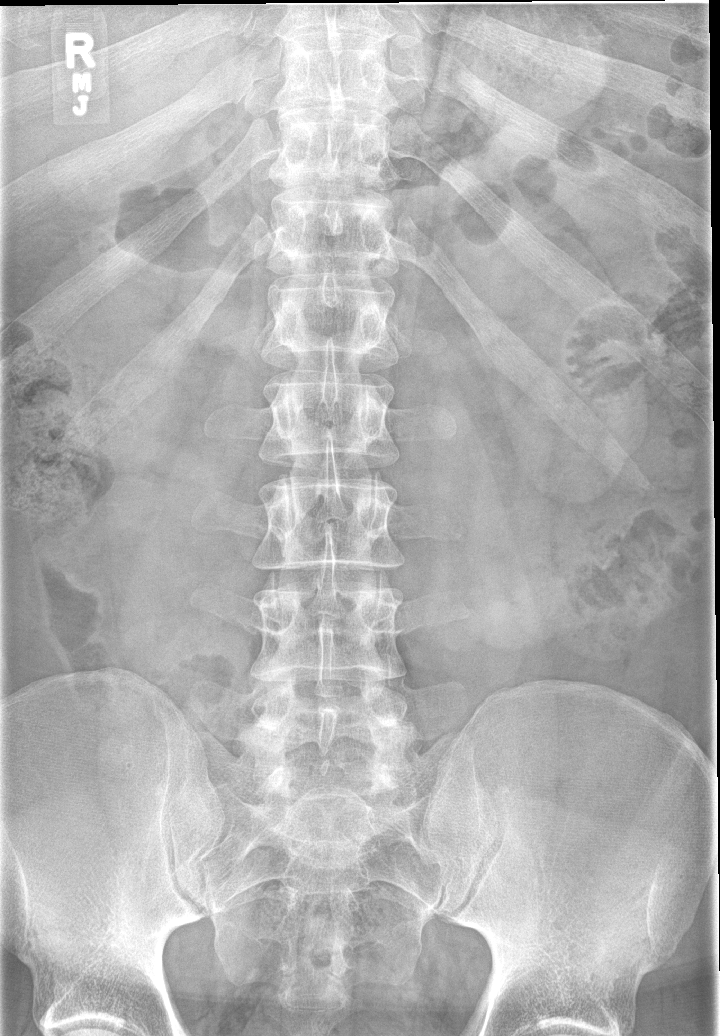

[l-spine obl (1 of 2)]
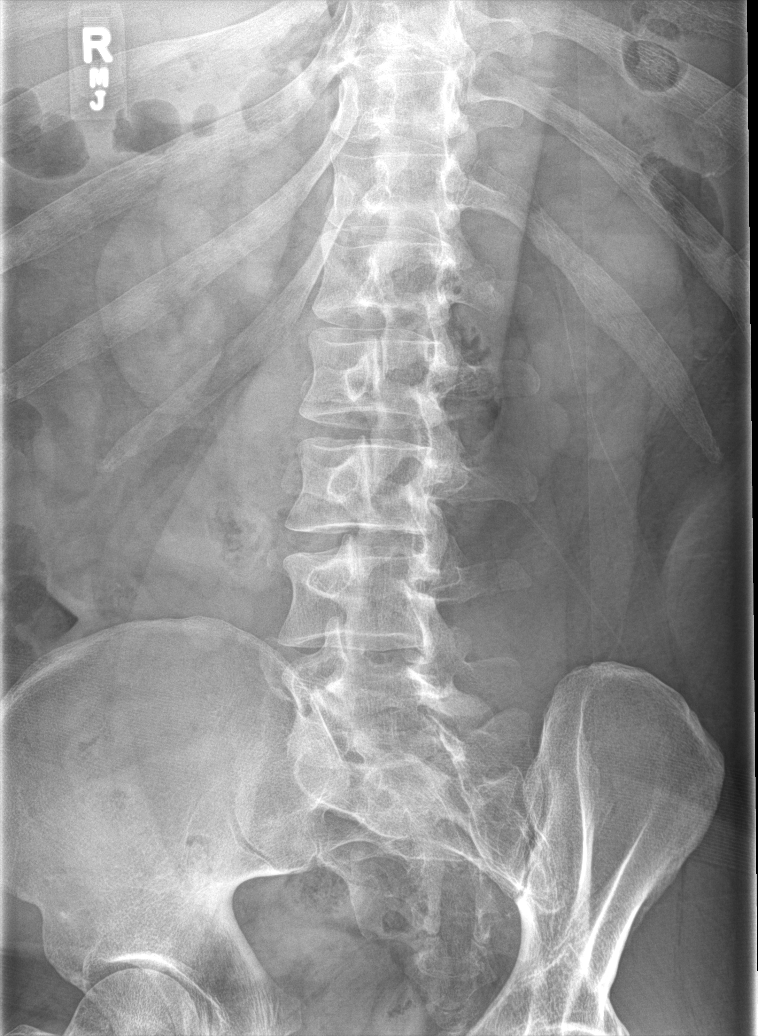

[l-spine obl (2 of 2)]
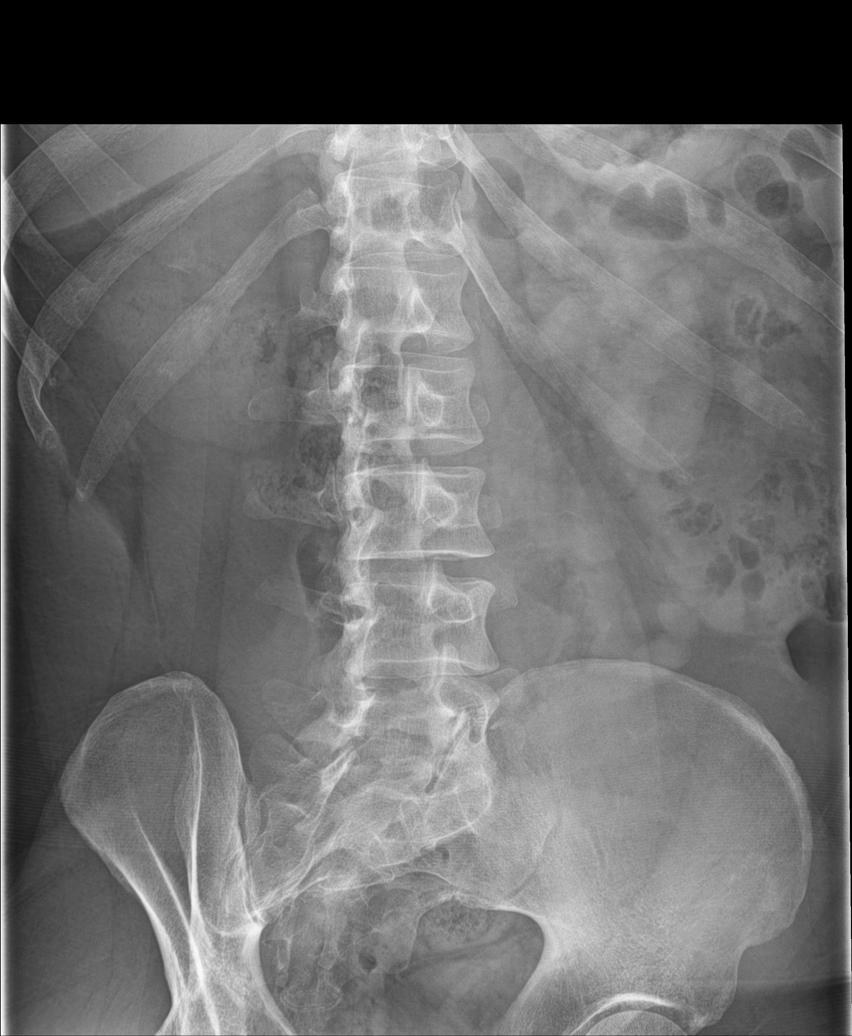

[l-spine lat]
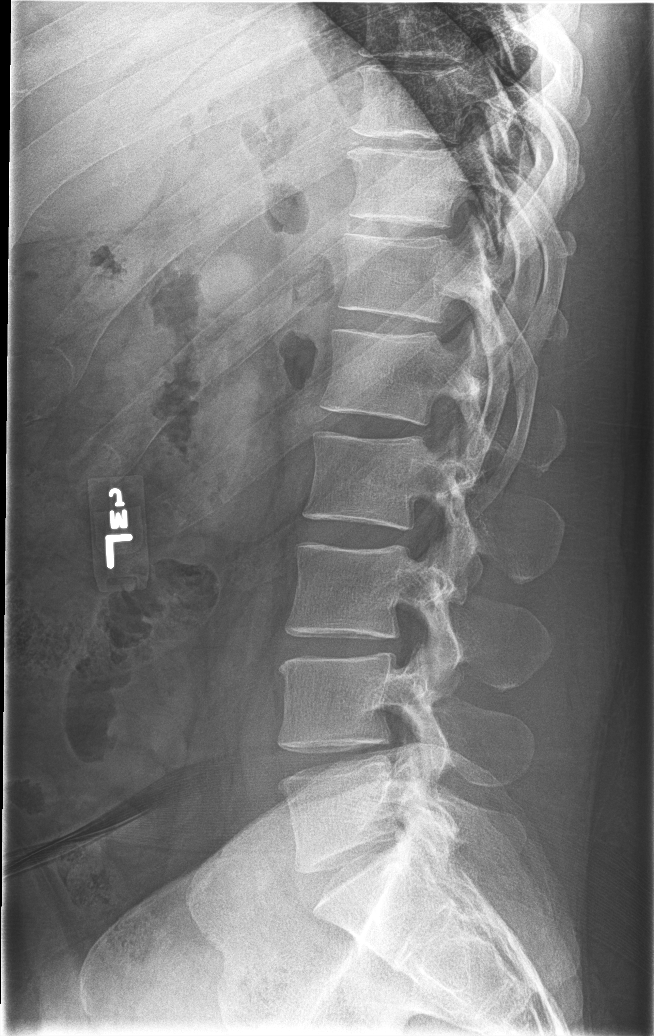

[l-spine spot]
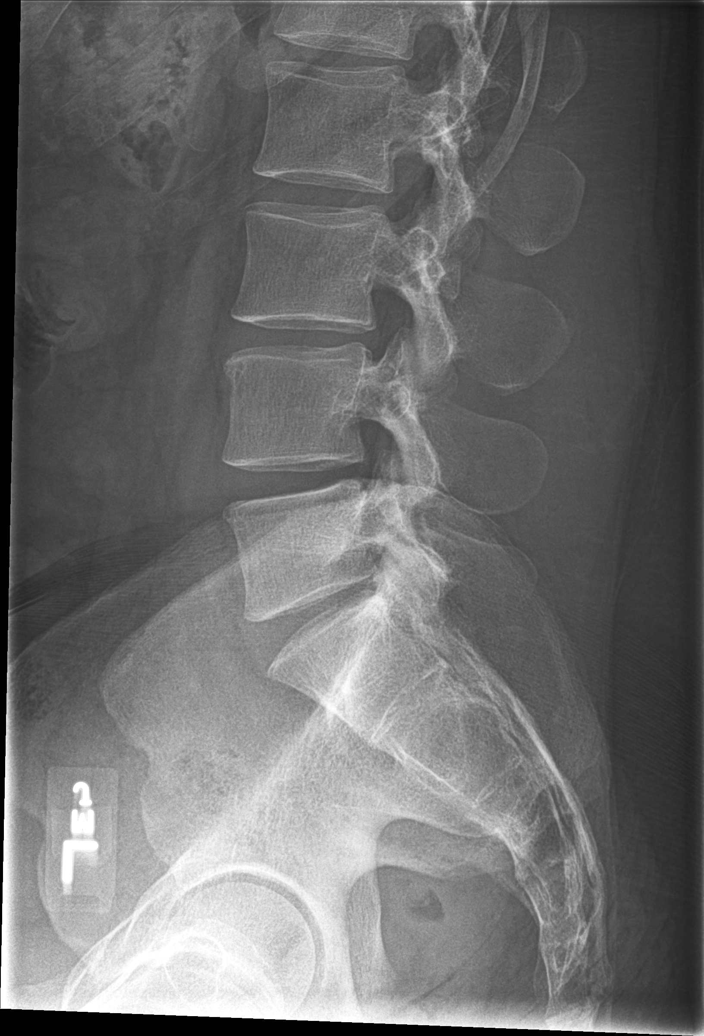

[5 of 5 positions shown; findings below may reference images not displayed]

FINDINGS: No fracture or dislocation of the lumbar spine. Minimal endplate
osteophytosis at L3-L4 and L4-L5 without significant disc space
height loss. Mild facet degenerative change of L5-S1 with otherwise
intact facets. Nonobstructive pattern of overlying bowel gas.
IMPRESSION: 1.  No fracture or dislocation of the lumbar spine.

2. Minimal endplate osteophytosis at L3-L4 and L4-L5 without
significant disc space height loss. Mild facet degenerative change
of L5-S1 with otherwise intact facets. Lumbar disc and neural
foraminal pathology may be further evaluated by MRI if indicated by
neurologically localizing signs and symptoms.

## 2024-01-12 ENCOUNTER — Telehealth: Payer: Self-pay | Admitting: Family Medicine

## 2024-01-12 ENCOUNTER — Other Ambulatory Visit: Payer: Self-pay | Admitting: Family Medicine

## 2024-01-12 DIAGNOSIS — M5412 Radiculopathy, cervical region: Secondary | ICD-10-CM

## 2024-01-12 MED ORDER — BACLOFEN 20 MG PO TABS: 20.0000 mg | ORAL_TABLET | Freq: Three times a day (TID) | ORAL | 0 refills | Status: DC | PRN

## 2024-01-12 NOTE — Telephone Encounter (Signed)
Copied from CRM [id number]. Topic: General - Other >> Jan 12, 2024  2:06 PM Sean Freeman wrote: Reason for CRM: Pt stated that he is scheduled to see a specialist about his neck and he was asked to get the imaging results put on a cd so he could bring it in with him. Cb# [phone]

## 2024-01-12 NOTE — Telephone Encounter (Signed)
Pt reports that he neglected to get his Rx in a timely manner so the pharmacy put it back. Pt requests that the Rx for baclofen (LIORESAL) 20 MG tablet be sent again.   Medication Refill - Most Recent Primary Care Visit:  Provider: Jerrol Banana  Department: [facility]  Visit Type: PHYSICAL  Date: 11/09/2023  Medication: baclofen (LIORESAL) 20 MG tablet and celecoxib (CELEBREX) 100 MG capsule   Has the patient contacted their pharmacy? Yes  Is this the correct pharmacy for this prescription? Yes If no, delete pharmacy and type the correct one.  This is the patient's preferred pharmacy:  [id number] - [address] [place] [address] Phone: [phone] Fax: [phone]   Has the prescription been filled recently? No  Is the patient out of the medication? Yes  Has the patient been seen for an appointment in the last year OR does the patient have an upcoming appointment? Yes  Can we respond through MyChart? Yes  Agent: Please be advised that Rx refills may take up to 3 business days. We ask that you follow-up with your pharmacy.

## 2024-01-12 NOTE — Telephone Encounter (Signed)
Requested Prescriptions  Pending Prescriptions Disp Refills   baclofen (LIORESAL) 20 MG tablet 270 tablet 0    Sig: Take 1 tablet (20 mg total) by mouth 3 (three) times daily as needed for muscle spasms.     Analgesics:  Muscle Relaxants - baclofen Passed - 01/12/2024  3:29 PM      Passed - Cr in normal range and within 180 days    Creatinine, Ser  Date Value Ref Range Status  11/23/2023 1.16 0.76 - 1.27 mg/dL Final         Passed - eGFR is 30 or above and within 180 days    eGFR  Date Value Ref Range Status  11/23/2023 83 >59 mL/min/1.73 Final         Passed - Valid encounter within last 6 months    Recent Outpatient Visits           2 months ago Healthcare maintenance   [facility] Primary Care & Sports Medicine at [facility], Ocie Bob, MD   2 months ago Nasopharyngitis acute   [facility] Primary [facility] at [facility], Ocie Bob, MD   4 months ago Radiculitis of left cervical region   [facility] Care & Sports Medicine at [facility], Ocie Bob, MD   4 months ago Radiculitis of left cervical region   [facility] at [facility], Ocie Bob, MD   1 year ago Mixed hyperlipidemia   [facility] Primary Care & Sports Medicine at [facility], Ocie Bob, MD       Future Appointments             In 4 weeks Elie Goody, MD [facility]   In 10 months Ashley Royalty, Ocie Bob, MD [facility] Primary Care & Sports Medicine at [facility], [facility]

## 2024-01-13 MED ORDER — BACLOFEN 20 MG PO TABS: 20.0000 mg | ORAL_TABLET | Freq: Three times a day (TID) | ORAL | 0 refills | Status: DC | PRN

## 2024-01-13 MED ORDER — CELECOXIB 100 MG PO CAPS: 100.0000 mg | ORAL_CAPSULE | Freq: Two times a day (BID) | ORAL | 0 refills | Status: DC | PRN

## 2024-02-05 ENCOUNTER — Telehealth: Payer: Self-pay | Admitting: Family Medicine

## 2024-02-05 ENCOUNTER — Other Ambulatory Visit: Payer: Self-pay

## 2024-02-05 NOTE — Telephone Encounter (Signed)
Alvan Dame calling [employer] [facility] with [facility] is calling to report that prior authorization with denied on MRI neck and lumber due to lack of treatment with the last 6 weeks. Please advise with the pt [phone]

## 2024-02-05 NOTE — Telephone Encounter (Signed)
Please review.  KP

## 2024-02-09 ENCOUNTER — Other Ambulatory Visit: Payer: Self-pay | Admitting: Family Medicine

## 2024-02-09 DIAGNOSIS — M5412 Radiculopathy, cervical region: Secondary | ICD-10-CM

## 2024-02-09 NOTE — Telephone Encounter (Signed)
 Requested Prescriptions  Pending Prescriptions Disp Refills   celecoxib (CELEBREX) 100 MG capsule [Pharmacy Med Name: CELECOXIB 100 MG CAPSULE] 60 capsule 0    Sig: TAKE 1 CAPSULE BY MOUTH 2 TIMES DAILY AS NEEDED.     Analgesics:  COX2 Inhibitors Failed - 02/09/2024  3:11 PM      Failed - Manual Review: Labs are only required if the patient has taken medication for more than 8 weeks.      Failed - ALT in normal range and within 360 days    ALT  Date Value Ref Range Status  11/23/2023 53 (H) 0 - 44 IU/L Final         Passed - HGB in normal range and within 360 days    Hemoglobin  Date Value Ref Range Status  11/23/2023 15.5 13.0 - 17.7 g/dL Final         Passed - Cr in normal range and within 360 days    Creatinine, Ser  Date Value Ref Range Status  11/23/2023 1.16 0.76 - 1.27 mg/dL Final         Passed - HCT in normal range and within 360 days    Hematocrit  Date Value Ref Range Status  11/23/2023 47.1 37.5 - 51.0 % Final         Passed - AST in normal range and within 360 days    AST  Date Value Ref Range Status  11/23/2023 33 0 - 40 IU/L Final         Passed - eGFR is 30 or above and within 360 days    eGFR  Date Value Ref Range Status  11/23/2023 83 >59 mL/min/1.73 Final         Passed - Patient is not pregnant      Passed - Valid encounter within last 12 months    Recent Outpatient Visits           3 months ago Healthcare maintenance   [facility] Primary Care & Sports Medicine at [facility], Ocie Bob, MD   3 months ago Nasopharyngitis acute   [place] Medicine at [facility], Ocie Bob, MD   5 months ago Radiculitis of left cervical region   [facility] Care & Sports Medicine at [facility], Ocie Bob, MD   5 months ago Radiculitis of left cervical region   [facility] Care & Sports Medicine at [facility], Ocie Bob, MD   1 year ago Mixed hyperlipidemia   [facility] Primary Care & Sports Medicine at [facility], Ocie Bob, MD       Future Appointments             In 2 weeks Elie Goody, MD [facility]   In 9 months Ashley Royalty, Ocie Bob, MD [facility] Primary Care & Sports Medicine at [facility] [place], [facility]

## 2024-02-10 ENCOUNTER — Ambulatory Visit: Payer: BC Managed Care – PPO | Admitting: Dermatology

## 2024-02-18 ENCOUNTER — Encounter: Payer: Self-pay | Admitting: Family Medicine

## 2024-02-18 ENCOUNTER — Ambulatory Visit (INDEPENDENT_AMBULATORY_CARE_PROVIDER_SITE_OTHER): Payer: 59 | Admitting: Family Medicine

## 2024-02-18 VITALS — BP 128/88 | HR 77 | Ht 67.0 in | Wt 220.4 lb

## 2024-02-18 DIAGNOSIS — M5412 Radiculopathy, cervical region: Secondary | ICD-10-CM | POA: Diagnosis not present

## 2024-02-18 DIAGNOSIS — M4722 Other spondylosis with radiculopathy, cervical region: Secondary | ICD-10-CM

## 2024-02-18 DIAGNOSIS — E782 Mixed hyperlipidemia: Secondary | ICD-10-CM

## 2024-02-18 DIAGNOSIS — R9089 Other abnormal findings on diagnostic imaging of central nervous system: Secondary | ICD-10-CM | POA: Diagnosis not present

## 2024-02-18 MED ORDER — ATORVASTATIN CALCIUM 10 MG PO TABS: 10.0000 mg | ORAL_TABLET | Freq: Every day | ORAL | 0 refills | Status: DC

## 2024-02-18 MED ORDER — GABAPENTIN 300 MG PO CAPS: 300.0000 mg | ORAL_CAPSULE | Freq: Every day | ORAL | 0 refills | Status: DC

## 2024-02-18 NOTE — Assessment & Plan Note (Addendum)
 History of Present Illness Sean Freeman is a 38 year old male who presents with numbness and tingling in his extremities.  He experiences numbness and tingling in the arms, additionally legs and feet, which has progressively worsened. The numbness began in the feet and has spread to the insides of the legs, described as feeling like 'something in the bottom of my shoe'. Symptoms are less severe in the morning but worsen throughout the day, impacting his ability to walk at work, where he walks seven to eight miles daily. No relapsing and remitting symptoms reported upon questioning, such as transient numbness, vision changes, etc. Persistent numbness in the feet and legs, with occasional numbness and tingling radiating to the left side of the chest wall.  He uses gabapentin 100 mg nightly as needed for nerve pain, taking it three to four times a week, which helps reduce symptoms in the morning. He also takes Celebrex twice daily for inflammation and omeprazole for reflux. He has not been to the chiropractor in ten weeks, which he previously attended weekly for lower back issues.   RADIOLOGY MRI cervical spine: Mild levoconvex scoliosis of the cervical spine, reversal of the normal cervical lordosis, moderate left and mild right uncovertebral osteophytosis with mild left neuroforaminal narrowing at C3-C4, moderate bulging disc and osteophytic complex with mild to moderate left and mild right neuroforaminal narrowing and mild spinal canal stenosis at C4-C5, broad central right subarticular disc protrusion with moderate to severe right and mild to moderate left neuroforaminal narrowing and mild spinal canal stenosis at C5-C6, mild bulging disc and osteophytic complex with mild to moderate left and mild right neuroforaminal narrowing at C6-C7, multiple patchy short segment T2 hyperintensities throughout the cervical cord, most pronounced at C3 and C4, questionable patchy T2 hyperintensity within the pons.  (02/11/2024)  Assessment and Plan Cervical Spondylosis with Radiculopathy MRI findings of moderate to severe osteophytosis and disc bulging at multiple levels (C3-C4, C4-C5, C5-C6, C6-C7) with associated spinal cord compression and deformity. Symptoms of numbness and tingling in the legs and feet, and left-sided chest wall are most likely attributable to similar findings in the thoracic and lumbar spine. -Increase Gabapentin to 300mg  at night, and 100mg  in the morning and midday as tolerated.  No driving/machinery x 8 hours after dosing. -Contact us if able to tolerate the gabapentin 100 mg dose for additional prescription. -Continue Celebrex twice daily.  Take with food. -Referral to neurology further evaluate MRI findings placed. -Follow-up 6 weeks via video visit.

## 2024-02-18 NOTE — Progress Notes (Signed)
 Primary Care / Sports Medicine Office Visit  Patient Information:  Patient ID: Sean Freeman, male DOB: [birth year] Age: 38 y.o. MRN: [id number]   Sean Freeman is a pleasant 38 y.o. male presenting with the following:  Chief Complaint  Patient presents with   hand numbness    Bilateral hand numbness has gotten worse over the last couple months. He has seen a hand specialist and got xrays and MRI. He would like to discuss the MRI findings today as a second opinion.     Vitals:   02/18/24 0952  BP: 128/88  Pulse: 77  SpO2: 97%   Vitals:   02/18/24 0952  Weight: 220 lb 6.4 oz (100 kg)  Height: 5\' 7"  (1.702 m)   Body mass index is 34.52 kg/m.  No results found.   Independent interpretation of notes and tests performed by another provider:   None  Procedures performed:   None  Pertinent History, Exam, Impression, and Recommendations:   Problem List Items Addressed This Visit     Mixed hyperlipidemia - Primary   He has a family history of high cholesterol and reports a personal history of elevated cholesterol levels, which he is concerned about managing.  Hyperlipidemia Elevated total cholesterol with high cholesterol to HDL ratio. Mildly elevated liver enzymes most likely secondary to hyperlipidemia.  Reports rash when he briefly dosed rosuvastatin. -New Rx for atorvastatin sent to pharmacy.  Patient to monitor for any adverse symptoms. -Repeat labs in 6 weeks to monitor response to treatment. -Follow-up 6 weeks via video visit.      Relevant Medications   atorvastatin (LIPITOR) 10 MG tablet   Other Relevant Orders   Comprehensive metabolic panel   Lipid panel   Other spondylosis with radiculopathy, cervical region   History of Present Illness Sean Freeman is a 38 year old male who presents with numbness and tingling in his extremities.  He experiences numbness and tingling in the arms, additionally legs and feet, which has progressively worsened. The  numbness began in the feet and has spread to the insides of the legs, described as feeling like 'something in the bottom of my shoe'. Symptoms are less severe in the morning but worsen throughout the day, impacting his ability to walk at work, where he walks seven to eight miles daily. No relapsing and remitting symptoms reported upon questioning, such as transient numbness, vision changes, etc. Persistent numbness in the feet and legs, with occasional numbness and tingling radiating to the left side of the chest wall.  He uses gabapentin 100 mg nightly as needed for nerve pain, taking it three to four times a week, which helps reduce symptoms in the morning. He also takes Celebrex twice daily for inflammation and omeprazole for reflux. He has not been to the chiropractor in ten weeks, which he previously attended weekly for lower back issues.   RADIOLOGY MRI cervical spine: Mild levoconvex scoliosis of the cervical spine, reversal of the normal cervical lordosis, moderate left and mild right uncovertebral osteophytosis with mild left neuroforaminal narrowing at C3-C4, moderate bulging disc and osteophytic complex with mild to moderate left and mild right neuroforaminal narrowing and mild spinal canal stenosis at C4-C5, broad central right subarticular disc protrusion with moderate to severe right and mild to moderate left neuroforaminal narrowing and mild spinal canal stenosis at C5-C6, mild bulging disc and osteophytic complex with mild to moderate left and mild right neuroforaminal narrowing at C6-C7, multiple patchy short segment T2 hyperintensities  throughout the cervical cord, most pronounced at C3 and C4, questionable patchy T2 hyperintensity within the pons. (02/11/2024)  Assessment and Plan Cervical Spondylosis with Radiculopathy MRI findings of moderate to severe osteophytosis and disc bulging at multiple levels (C3-C4, C4-C5, C5-C6, C6-C7) with associated spinal cord compression and deformity.  Symptoms of numbness and tingling in the legs and feet, and left-sided chest wall are most likely attributable to similar findings in the thoracic and lumbar spine. -Increase Gabapentin to 300mg  at night, and 100mg  in the morning and midday as tolerated.  No driving/machinery x 8 hours after dosing. -Contact us if able to tolerate the gabapentin 100 mg dose for additional prescription. -Continue Celebrex twice daily.  Take with food. -Referral to neurology further evaluate MRI findings placed. -Follow-up 6 weeks via video visit.      Relevant Medications   gabapentin (NEURONTIN) 300 MG capsule   Other Visit Diagnoses       Abnormal magnetic resonance imaging of spinal cord       Relevant Orders   Ambulatory referral to Neurology      I provided a total time of 43 minutes including both face-to-face and non-face-to-face time on 02/18/2024 inclusive of time utilized for medical chart review, information gathering, care coordination with staff, and documentation completion.   Orders & Medications Medications:  Meds ordered this encounter  Medications   gabapentin (NEURONTIN) 300 MG capsule    Sig: Take 1 capsule (300 mg total) by mouth at bedtime.    Dispense:  30 capsule    Refill:  0   atorvastatin (LIPITOR) 10 MG tablet    Sig: Take 1 tablet (10 mg total) by mouth daily.    Dispense:  90 tablet    Refill:  0   Orders Placed This Encounter  Procedures   Comprehensive metabolic panel   Lipid panel   Ambulatory referral to Neurology     Return in about 6 weeks (around 03/31/2024) for video visit.     Jerrol Banana, MD, [facility]   Primary Care Sports Medicine Primary Care and Sports Medicine at [facility]

## 2024-02-18 NOTE — Patient Instructions (Signed)
 Plan  Mixed Hyperlipidemia  1. Take atorvastatin 10 mg daily. Take in evening. 2. Get labs in 6 weeks. Get these done fasting and prior to our video visit so that we can review the results. 3. Schedule a video visit in 6 weeks.  Cervical Spondylosis with Radiculopathy  1. Increase gabapentin to 300 mg at night and as-needed dosing of the 100 mg in the morning and midday if tolerable. Contact for a refill. 2. Continue taking Celebrex twice daily with food. 3. Attend a neurology appointment once scheduled. 4. Schedule a video visit in 6 weeks.  Notes: - Avoid driving or using machinery for 8 hours after taking gabapentin and baclofen. - Contact us if you tolerate the gabapentin for a new prescription.

## 2024-02-18 NOTE — Assessment & Plan Note (Signed)
 He has a family history of high cholesterol and reports a personal history of elevated cholesterol levels, which he is concerned about managing.  Hyperlipidemia Elevated total cholesterol with high cholesterol to HDL ratio. Mildly elevated liver enzymes most likely secondary to hyperlipidemia.  Reports rash when he briefly dosed rosuvastatin. -New Rx for atorvastatin sent to pharmacy.  Patient to monitor for any adverse symptoms. -Repeat labs in 6 weeks to monitor response to treatment. -Follow-up 6 weeks via video visit.

## 2024-02-29 ENCOUNTER — Ambulatory Visit: Payer: BC Managed Care – PPO | Admitting: Dermatology

## 2024-02-29 ENCOUNTER — Encounter: Payer: Self-pay | Admitting: Dermatology

## 2024-02-29 DIAGNOSIS — D492 Neoplasm of unspecified behavior of bone, soft tissue, and skin: Secondary | ICD-10-CM | POA: Diagnosis not present

## 2024-02-29 DIAGNOSIS — M6740 Ganglion, unspecified site: Secondary | ICD-10-CM

## 2024-02-29 DIAGNOSIS — W908XXA Exposure to other nonionizing radiation, initial encounter: Secondary | ICD-10-CM

## 2024-02-29 DIAGNOSIS — D2372 Other benign neoplasm of skin of left lower limb, including hip: Secondary | ICD-10-CM

## 2024-02-29 DIAGNOSIS — L7211 Pilar cyst: Secondary | ICD-10-CM

## 2024-02-29 DIAGNOSIS — D1801 Hemangioma of skin and subcutaneous tissue: Secondary | ICD-10-CM

## 2024-02-29 DIAGNOSIS — L814 Other melanin hyperpigmentation: Secondary | ICD-10-CM

## 2024-02-29 DIAGNOSIS — Z1283 Encounter for screening for malignant neoplasm of skin: Secondary | ICD-10-CM | POA: Diagnosis not present

## 2024-02-29 DIAGNOSIS — D239 Other benign neoplasm of skin, unspecified: Secondary | ICD-10-CM

## 2024-02-29 DIAGNOSIS — L729 Follicular cyst of the skin and subcutaneous tissue, unspecified: Secondary | ICD-10-CM

## 2024-02-29 DIAGNOSIS — L905 Scar conditions and fibrosis of skin: Secondary | ICD-10-CM

## 2024-02-29 DIAGNOSIS — D225 Melanocytic nevi of trunk: Secondary | ICD-10-CM

## 2024-02-29 DIAGNOSIS — L578 Other skin changes due to chronic exposure to nonionizing radiation: Secondary | ICD-10-CM

## 2024-02-29 DIAGNOSIS — D229 Melanocytic nevi, unspecified: Secondary | ICD-10-CM

## 2024-02-29 DIAGNOSIS — D2362 Other benign neoplasm of skin of left upper limb, including shoulder: Secondary | ICD-10-CM

## 2024-02-29 DIAGNOSIS — M67431 Ganglion, right wrist: Secondary | ICD-10-CM

## 2024-02-29 HISTORY — DX: Other benign neoplasm of skin, unspecified: D23.9

## 2024-02-29 NOTE — Progress Notes (Signed)
 Follow-Up Visit   Subjective  Sean Freeman is a 38 y.o. male who presents for the following: Skin Cancer Screening and Full Body Skin Exam. No personal or family Hx of skin cancer or dysplastic nevi.   Check cysts on scalp. Dur: years. No hx of treatment. No family hx of cysts on scalp.  The patient presents for Total-Body Skin Exam (TBSE) for skin cancer screening and mole check. The patient has spots, moles and lesions to be evaluated, some may be new or changing and the patient may have concern these could be cancer.    The following portions of the chart were reviewed this encounter and updated as appropriate: medications, allergies, medical history  Review of Systems:  No other skin or systemic complaints except as noted in HPI or Assessment and Plan.  Objective  Well appearing patient in no apparent distress; mood and affect are within normal limits.  A full examination was performed including scalp, head, eyes, ears, nose, lips, neck, chest, axillae, abdomen, back, buttocks, bilateral upper extremities, bilateral lower extremities, hands, feet, fingers, toes, fingernails, and toenails. All findings within normal limits unless otherwise noted below.   Relevant physical exam findings are noted in the Assessment and Plan.  Right Dorsal Wrist/Hand Subcutaneous cystic nodule Left Spinal Upper Back 6 mm pink to Gayler irregular pigmented macule   Assessment & Plan   SKIN CANCER SCREENING PERFORMED TODAY.  ACTINIC DAMAGE - Chronic condition, secondary to cumulative UV/sun exposure - diffuse scaly erythematous macules with underlying dyspigmentation - Recommend daily broad spectrum sunscreen SPF 30+ to sun-exposed areas, reapply every 2 hours as needed.  - Staying in the shade or wearing long sleeves, sun glasses (UVA+UVB protection) and wide brim hats (4-inch brim around the entire circumference of the hat) are also recommended for sun protection.  - Call for new or changing  lesions.  LENTIGINES, HEMANGIOMAS - Benign normal skin lesions - Benign-appearing - Call for any changes  MELANOCYTIC NEVI - Tan-Gram and/or pink-flesh-colored symmetric macules and papules - Benign appearing on exam today - Observation - Call clinic for new or changing moles - Recommend daily use of broad spectrum spf 30+ sunscreen to sun-exposed areas.    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  Benign-appearing. 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 Discussed that a cyst is a benign growth that can grow over time and sometimes get irritated or inflamed. Recommend observation if it is not bothersome. Discussed option of surgical excision to remove it if it is growing, symptomatic, or other changes noted. Please call for new or changing lesions so they can be evaluated. Cannot prove they are benign cysts without excisional biopsy  DERMATOFIBROMAS Exam: Firm pink/Heberlein papulenodule with dimple sign at left shoulder, left posterior lower leg, left lateral thigh. Treatment Plan: A dermatofibroma is a benign growth possibly related to trauma, such as an insect bite, cut from shaving, or inflamed acne-type bump.  Treatment options to remove include shave or excision with resulting scar and risk of recurrence.  Since benign-appearing and not bothersome, will observe for now.     GANGLION CYST Right Dorsal Wrist/Hand Recommend referral to hand surgeon if area is bothersome. Patient deferred treatment at this time since lesion is non tender.  NEOPLASM OF SKIN Left Spinal Upper Back Skin / nail biopsy Type of biopsy: tangential   Informed consent: discussed and consent obtained   Timeout: patient name, date of birth, surgical site,  and procedure verified   Procedure prep:  Patient was prepped and draped in usual sterile fashion Prep type:  Isopropyl alcohol Anesthesia: the lesion was anesthetized in a standard fashion    Anesthetic:  1% lidocaine w/ epinephrine 1-100,000 buffered w/ 8.4% NaHCO3 Instrument used: DermaBlade   Hemostasis achieved with: pressure and aluminum chloride   Outcome: patient tolerated procedure well   Post-procedure details: sterile dressing applied and wound care instructions given   Dressing type: bandage and petrolatum   Specimen 1 - Surgical pathology Differential Diagnosis: Dysplastic nevus vs Melanoma  Check Margins: Yes MULTIPLE BENIGN NEVI   CHERRY ANGIOMA   LENTIGINES   ACTINIC ELASTOSIS   SCALP CYST   Return for TBSE 1-2 years.  I, Lawson Radar, CMA, am acting as scribe for Elie Goody, MD.   Documentation: I have reviewed the above documentation for accuracy and completeness, and I agree with the above.  Elie Goody, MD

## 2024-02-29 NOTE — Patient Instructions (Addendum)
 Wound Care Instructions  Cleanse wound gently with soap and water once a day then pat dry with clean gauze. Apply a thin coat of Petrolatum (petroleum jelly, "Vaseline") over the wound (unless you have an allergy to this). We recommend that you use a new, sterile tube of Vaseline. Do not pick or remove scabs. Do not remove the yellow or white "healing tissue" from the base of the wound.  Cover the wound with fresh, clean, nonstick gauze and secure with paper tape. You may use Band-Aids in place of gauze and tape if the wound is small enough, but would recommend trimming much of the tape off as there is often too much. Sometimes Band-Aids can irritate the skin.  You should call the office for your biopsy report after 1 week if you have not already been contacted.  If you experience any problems, such as abnormal amounts of bleeding, swelling, significant bruising, significant pain, or evidence of infection, please call the office immediately.  FOR ADULT SURGERY PATIENTS: If you need something for pain relief you may take 1 extra strength Tylenol (acetaminophen) AND 2 Ibuprofen (200mg  each) together every 4 hours as needed for pain. (do not take these if you are allergic to them or if you have a reason you should not take them.) Typically, you may only need pain medication for 1 to 3 days.       Recommend daily broad spectrum sunscreen SPF 30+ to sun-exposed areas, reapply every 2 hours as needed. Call for new or changing lesions.  Staying in the shade or wearing long sleeves, sun glasses (UVA+UVB protection) and wide brim hats (4-inch brim around the entire circumference of the hat) are also recommended for sun protection.    Melanoma ABCDEs  Melanoma is the most dangerous type of skin cancer, and is the leading cause of death from skin disease.  You are more likely to develop melanoma if you: Have light-colored skin, light-colored eyes, or red or blond hair Spend a lot of time in the sun Tan  regularly, either outdoors or in a tanning bed Have had blistering sunburns, especially during childhood Have a close family member who has had a melanoma Have atypical moles or large birthmarks  Early detection of melanoma is key since treatment is typically straightforward and cure rates are extremely high if we catch it early.   The first sign of melanoma is often a change in a mole or a new dark spot.  The ABCDE system is a way of remembering the signs of melanoma.  A for asymmetry:  The two halves do not match. B for border:  The edges of the growth are irregular. C for color:  A mixture of colors are present instead of an even brown color. D for diameter:  Melanomas are usually (but not always) greater than 6mm - the size of a pencil eraser. E for evolution:  The spot keeps changing in size, shape, and color.  Please check your skin once per month between visits. You can use a small mirror in front and a large mirror behind you to keep an eye on the back side or your body.   If you see any new or changing lesions before your next follow-up, please call to schedule a visit.  Please continue daily skin protection including broad spectrum sunscreen SPF 30+ to sun-exposed areas, reapplying every 2 hours as needed when you're outdoors.   Staying in the shade or wearing long sleeves, sun glasses (UVA+UVB protection) and  wide brim hats (4-inch brim around the entire circumference of the hat) are also recommended for sun protection.     Due to recent changes in healthcare laws, you may see results of your pathology and/or laboratory studies on MyChart before the doctors have had a chance to review them. We understand that in some cases there may be results that are confusing or concerning to you. Please understand that not all results are received at the same time and often the doctors may need to interpret multiple results in order to provide you with the best plan of care or course of  treatment. Therefore, we ask that you please give [place] 2 business days to thoroughly review all your results before contacting the office for clarification. Should we see a critical lab result, you will be contacted sooner.   If You Need Anything After Your Visit  If you have any questions or concerns for your doctor, please call our main line at [phone] and press option 4 to reach your doctor's medical assistant. If no one answers, please leave a voicemail as directed and we will return your call as soon as possible. Messages left after 4 pm will be answered the following business day.   You may also send [place] a message via MyChart. We typically respond to MyChart messages within 1-2 business days.  For prescription refills, please ask your pharmacy to contact our office. Our fax number is [phone].  If you have an urgent issue when the clinic is closed that cannot wait until the next business day, you can page your doctor at the number below.    Please note that while we do our best to be available for urgent issues outside of office hours, we are not available 24/7.   If you have an urgent issue and are unable to reach [place], you may choose to seek medical care at your doctor's office, retail clinic, urgent care center, or emergency room.  If you have a medical emergency, please immediately call 911 or go to the emergency department.  Pager Numbers  - Dr. Gwen Pounds: [phone]  - Dr. Roseanne Reno: [phone]  - Dr. Katrinka Blazing: [phone]   In the event of inclement weather, please call our main line at [phone] for an update on the status of any delays or closures.  Dermatology Medication Tips: Please keep the boxes that topical medications come in in order to help keep track of the instructions about where and how to use these. Pharmacies typically print the medication instructions only on the boxes and not directly on the medication tubes.   If your medication is too expensive,  please contact our office at [phone] option 4 or send [place] a message through MyChart.   We are unable to tell what your co-pay for medications will be in advance as this is different depending on your insurance coverage. However, we may be able to find a substitute medication at lower cost or fill out paperwork to get insurance to cover a needed medication.   If a prior authorization is required to get your medication covered by your insurance company, please allow [place] 1-2 business days to complete this process.  Drug prices often vary depending on where the prescription is filled and some pharmacies may offer cheaper prices.  The website www.goodrx.com contains coupons for medications through different pharmacies. The prices here do not account for what the cost may be with help from insurance (it may be cheaper with your insurance), but the website  can give you the price if you did not use any insurance.  - You can print the associated coupon and take it with your prescription to the pharmacy.  - You may also stop by our office during regular business hours and pick up a GoodRx coupon card.  - If you need your prescription sent electronically to a different pharmacy, notify our office through [facility] or by phone at [phone] option 4.     Si Usted Necesita Algo Despus de Su Visita  Tambin puede enviarnos un mensaje a travs de Clinical cytogeneticist. Por lo general respondemos a los mensajes de MyChart en el transcurso de 1 a 2 das hbiles.  Para renovar recetas, por favor pida a su farmacia que se ponga en contacto con nuestra oficina. Annie Sable de fax es [place] [phone].  Si tiene un asunto urgente cuando la clnica est cerrada y que no puede esperar hasta el siguiente da hbil, puede llamar/localizar a su doctor(a) al nmero que aparece a continuacin.   Por favor, tenga en cuenta que aunque hacemos todo lo posible para estar disponibles para asuntos urgentes fuera del  horario de [employer], no estamos disponibles las 24 horas del da, los 7 [address]  [address] de la [place].   Si tiene un problema urgente y no puede comunicarse con nosotros, puede optar por buscar atencin mdica  en el consultorio de su doctor(a), en una clnica privada, en un centro de atencin urgente o en una sala de emergencias.  Si tiene Engineer, drilling, por favor llame inmediatamente al 911 o vaya a la sala de emergencias.  Nmeros de bper  - Dr. Gwen Pounds: [phone]  - Dra. Roseanne Reno: [phone]  - Dr. Katrinka Blazing: [phone]   En caso de inclemencias del tiempo, por favor llame a Lacy Duverney principal al [phone] para una actualizacin sobre el [place] de cualquier retraso o cierre.  Consejos para la medicacin en dermatologa: Por favor, guarde las cajas en las que vienen los medicamentos de uso tpico para ayudarle a seguir las instrucciones sobre dnde y cmo usarlos. Las farmacias generalmente imprimen las instrucciones del medicamento slo en las cajas y no directamente en los tubos del [place].   Si su medicamento es muy caro, por favor, pngase en contacto con Rolm Gala llamando al [phone] y presione la opcin 4 o envenos un mensaje a travs de Clinical cytogeneticist.   No podemos decirle cul ser su copago por los medicamentos por adelantado ya que esto es diferente dependiendo de la cobertura de su seguro. Sin embargo, es posible que podamos encontrar un medicamento sustituto a Audiological scientist un formulario para que el seguro cubra el medicamento que se considera necesario.   Si se requiere una autorizacin previa para que su compaa de seguros [place] su medicamento, por favor permtanos de 1 a 2 das hbiles para completar [address]  Los precios de los medicamentos varan con frecuencia dependiendo del Environmental consultant de dnde se surte la receta y alguna farmacias pueden ofrecer precios ms baratos.  El sitio web www.goodrx.com tiene cupones para medicamentos de Engineer, civil (consulting). Los precios aqu no tienen en cuenta lo que podra costar con la ayuda del seguro (puede ser ms barato con su seguro), pero el sitio web puede darle el precio si no utiliz Tourist information centre manager.  - Puede imprimir el cupn correspondiente y llevarlo con su receta a la farmacia.  - Tambin puede pasar por nuestra oficina durante el horario de atencin regular y Education officer, museum una tarjeta de cupones de GoodRx.  -  Si necesita que su receta se enve electrnicamente a Psychiatrist, informe a nuestra oficina a travs de MyChart de [place] o por telfono llamando al [phone] y presione la opcin 4.

## 2024-03-03 LAB — SURGICAL PATHOLOGY

## 2024-03-06 NOTE — Progress Notes (Addendum)
 [place]  [facility]  PATIENT: Sean Freeman DOB: May 01, 1986  REFERRING DOCTOR OR PCP: Joseph Berkshire, MD SOURCE: Patient, notes from [facility], imaging and lab reports, MRI images personally reviewed.  _________________________________   HISTORICAL  CHIEF COMPLAINT:  Chief Complaint  Patient presents with   New Patient (Initial Visit)    Rm11, wife present, NP Internal referral for abnormal MRI, concern for MS/Also received a paper proficient referral from [facility], [employer] [id number] lower and upper extremity numbness that is widespread to the chest. Bilateral hands tingly and numbness and also in feet. Right foot drop. Urinary hesitancy (can't pee in a hurry), "neck jolting" radiates to feet,     HISTORY OF PRESENT ILLNESS:  I had the pleasure of seeing patient, Sean Freeman, at [facility] for neurologic consultation regarding his sensory problems that have developed over the last 9 months, other symptoms and abnormal MRI of the cervical spine worrisome for demyelination.Marland Kitchen  He is a 38 year old man who was reporting numbness in his hands and neck pain since mid 2024.  He saw Dr. Shon Baton at [facility] and an MRI of the cervical spine showed mild to moderate multilevel spinal stenosis but also showed multiple T2 hyperintense foci concerning for demyelination ad he is being referred for further evaluation.  He notes the numbness in the left chest one morning in mid July 2024.   He also noted sensation was a little different in the back.    Over a short period of time, he notes bilateral sensory symptoms and altered sensation in his feet.   He notes the dysesthesias increase with certain positions, especially at night.   He gets an electric jolt when he flexes his neck that goes all the way down to his legs.   He denies weakness.   However, he feels grip is altered due to reduced sensation.   The right foot will sometimes rag a bit.        Initial event with left chest numbness started June/July.   He noted the onset of hand numbness  one day in October and he felt they were asleep.   Then in December.Marland Kitchenr he had a few weeks where his feet were involved.  Som symptoms have improved but then other symptoms started.    Currently, he feels his gait is fine.  He goes downstairs without using the bannister.    Sometimes the right foot starts dragging if he goes longer distance.   He has numbness in his hands and feet but the chest/back numbness resolved.   There is some numbness I'm in the right arm and near his left knee.   He has a Lhermitte sign some afternoon  He notes mild urinary changes and has reduced stream and some urgency.    He denies nocturia more tan once.       Vision is doing well.  No diplopia.   Eyes sometimes are blurry in the morning but better back to baseline after a few minutes.  He denies changes in cognition and mood but has more stress with the symptoms and potential diagnosis.   He sleeps well for the most part though numbness sometimes awakens him.    He notes some fatigue but similar to last year.     He is otherwise fairly healthy and stays active.  He was placed on Celebrex and baclofen for his neck pain.  He has mild hyperlipidemia.  He recently has had a skin excision of a  dysplastic nevus and may have a larger excision soon.  Imaging  MRI of the cervical spine showed T2 hyperintense foci posterolaterally to the left adjacent to C2, posteriorly adjacent to C3, generally adjacent to C4, Posteriorly adjacent to C4-C5, posterolaterally to the right adjacent to upper C5, posterolaterally to the left adjacent to T1.  Contrast was not given with this MRI.  Additionally, there are degenerative changes that are maximal at C5-C6.  There is mild spinal stenosis due to a right paramedian disc protrusion that also causes moderate right foraminal narrowing.    REVIEW OF SYSTEMS: Constitutional: No fevers, chills,  sweats, or change in appetite Eyes: No visual changes, double vision, eye pain Ear, nose and throat: No hearing loss, ear pain, nasal congestion, sore throat Cardiovascular: No chest pain, palpitations Respiratory:  No shortness of breath at rest or with exertion.   No wheezes GastrointestinaI: No nausea, vomiting, diarrhea, abdominal pain, fecal incontinence Genitourinary:  No dysuria, urinary retention or frequency.  No nocturia. Musculoskeletal:  No neck pain, back pain Integumentary: No rash, pruritus, skin lesions Neurological: as above Psychiatric: No depression at this time.  No anxiety Endocrine: No palpitations, diaphoresis, change in appetite, change in weigh or increased thirst Hematologic/Lymphatic:  No anemia, purpura, petechiae. Allergic/Immunologic: No itchy/runny eyes, nasal congestion, recent allergic reactions, rashes  ALLERGIES: Allergies  Allergen Reactions   Amoxicillin Hives   Penicillins Hives   Rosuvastatin Rash    HOME MEDICATIONS:  Current Outpatient Medications:    atorvastatin (LIPITOR) 10 MG tablet, Take 1 tablet (10 mg total) by mouth daily., Disp: 90 tablet, Rfl: 0   baclofen (LIORESAL) 20 MG tablet, Take 1 tablet (20 mg total) by mouth 3 (three) times daily as needed for muscle spasms., Disp: 270 tablet, Rfl: 0   celecoxib (CELEBREX) 100 MG capsule, TAKE 1 CAPSULE BY MOUTH 2 TIMES DAILY AS NEEDED., Disp: 60 capsule, Rfl: 0   gabapentin (NEURONTIN) 300 MG capsule, Take 1 capsule (300 mg total) by mouth at bedtime., Disp: 30 capsule, Rfl: 0   Vitamin D, Ergocalciferol, (DRISDOL) 1.25 MG (50000 UNIT) CAPS capsule, Take 1 capsule (50,000 Units total) by mouth every 7 (seven) days. Take for 8 total doses(weeks), Disp: 8 capsule, Rfl: 0  PAST MEDICAL HISTORY: Past Medical History:  Diagnosis Date   Allergy    Dysplastic nevus 02/29/2024   Left spinal upper back. Severe atypia, with focal scar, margin close. Excision pending   GERD (gastroesophageal  reflux disease)    Hyperlipidemia     PAST SURGICAL HISTORY: Past Surgical History:  Procedure Laterality Date   TYMPANOSTOMY TUBE PLACEMENT Bilateral     FAMILY HISTORY: Family History  Problem Relation Age of Onset   Diabetes Mother    Allergies Brother    Diabetes Paternal Uncle    Heart attack Paternal Uncle    Diabetes Paternal Uncle    Heart attack Paternal Uncle    Diabetes Paternal Uncle    Heart attack Paternal Uncle    Diabetes Maternal Grandmother    Heart attack Maternal Grandmother    Diabetes Maternal Grandfather    Heart attack Maternal Grandfather     SOCIAL HISTORY: Social History   Socioeconomic History   Marital status: Significant Other    Spouse name: heather   Number of children: 2   Years of education: 12   Highest education level: High school graduate  Occupational History   Not on file  Tobacco Use   Smoking status: Never    Passive exposure: Never  Smokeless tobacco: Never  Vaping Use   Vaping status: Never Used  Substance and Sexual Activity   Alcohol use: Not Currently    Comment: social   Drug use: Never   Sexual activity: Yes    Birth control/protection: Condom  Other Topics Concern   Not on file  Social History Narrative   Not on file   Social Drivers of Health   Financial Resource Strain: Not on file  Food Insecurity: Not on file  Transportation Needs: Not on file  Physical Activity: Not on file  Stress: Not on file  Social Connections: Not on file  Intimate Partner Violence: Not on file       PHYSICAL EXAM  Vitals:   03/09/24 0852  BP: (!) 144/101  Pulse: 77  Weight: 223 lb (101.2 kg)  Height: 5\' 7"  (1.702 m)    Body mass index is 34.93 kg/m.   General: The patient is well-developed and well-nourished and in no acute distress  HEENT:  Head is [place]/AT.  Sclera are anicteric.  Funduscopic exam shows normal optic discs and retinal vessels.  Neck: No carotid bruits are noted.  The neck is  nontender.  Cardiovascular: The heart has a regular rate and rhythm with a normal S1 and S2. There were no murmurs, gallops or rubs.    Skin: Extremities are without rash or  edema.   Excision on left back (dysplastic nevus; pre-cancerous)  Musculoskeletal:  Back is nontender  Neurologic Exam  Mental status: The patient is alert and oriented x 3 at the time of the examination. The patient has apparent normal recent and remote memory, with an apparently normal attention span and concentration ability.   Speech is normal.  Cranial nerves: Extraocular movements are full. Pupils are equal, round, and reactive to light and accomodation.  Color vision was symmetric.  Facial symmetry is present. There is good facial sensation to soft touch bilaterally.Facial strength is normal.  Trapezius and sternocleidomastoid strength is normal. No dysarthria is noted.  The tongue is midline, and the patient has symmetric elevation of the soft palate. No obvious hearing deficits are noted.  Motor:  Muscle bulk is normal.   Tone is normal. Strength is  5 / 5 in all 4 extremities.   Sensory: Sensory testing is intact to pinprick, soft touch and vibration sensation in the arms and slightly reduced vibration of the left leg relative to the right.    Coordination: Cerebellar testing reveals good finger-nose-finger and heel-to-shin bilaterally.  Gait and station: Station is normal.   Gait is normal. Tandem gait is normal. Romberg is negative.   Reflexes: Deep tendon reflexes are symmetric and normal (1 in arms, 2 at knees and ankles).   Plantar responses are flexor.    DIAGNOSTIC DATA (LABS, IMAGING, TESTING) - I reviewed patient records, labs, notes, testing and imaging myself where available.  Lab Results  Component Value Date   WBC 5.2 11/23/2023   HGB 15.5 11/23/2023   HCT 47.1 11/23/2023   MCV 86 11/23/2023   PLT 284 11/23/2023      Component Value Date/Time   NA 139 11/23/2023 0900   K 4.7  11/23/2023 0900   CL 102 11/23/2023 0900   CO2 22 11/23/2023 0900   GLUCOSE 103 (H) 11/23/2023 0900   BUN 17 11/23/2023 0900   CREATININE 1.16 11/23/2023 0900   CALCIUM 10.1 11/23/2023 0900   PROT 7.4 11/23/2023 0900   ALBUMIN 4.7 11/23/2023 0900   AST 33 11/23/2023 0900  ALT 53 (H) 11/23/2023 0900   ALKPHOS 122 (H) 11/23/2023 0900   BILITOT 0.5 11/23/2023 0900   Lab Results  Component Value Date   CHOL 208 (H) 11/23/2023   HDL 34 (L) 11/23/2023   LDLCALC 131 (H) 11/23/2023   TRIG 241 (H) 11/23/2023   CHOLHDL 6.1 (H) 11/23/2023   Lab Results  Component Value Date   HGBA1C 5.4 11/23/2023   No results found for: "VITAMINB12" Lab Results  Component Value Date   TSH 2.400 11/23/2023       ASSESSMENT AND PLAN  Demyelinating disease of central nervous system (HCC) - Plan: Hepatitis B surface antigen, HIV Antibody (routine testing w rflx), IgG, IgA, IgM, QuantiFERON-TB Gold Plus, CBC with Differential/Platelet, Comprehensive metabolic panel, Varicella zoster antibody, IgG, Hepatitis B core antibody, total, Anti-MOG, Serum, MR BRAIN W WO CONTRAST  High risk medication use - Plan: Hepatitis B surface antigen, HIV Antibody (routine testing w rflx), IgG, IgA, IgM, QuantiFERON-TB Gold Plus, CBC with Differential/Platelet, Comprehensive metabolic panel, Varicella zoster antibody, IgG, Hepatitis B core antibody, total, Anti-MOG, Serum  Encounter for screening for HIV - Plan: HIV Antibody (routine testing w rflx)  Numbness - Plan: MR BRAIN W WO CONTRAST  In summary, Mr. Sagan is a 38 year old man who has had several episodes of numbness, 1 that also included some hand clumsiness over the last 9 months.  MRI of the cervical spine showed about 5 T2 hyperintense foci in the pattern consistent with multiple sclerosis.  I discussed with him that I do believe he most likely has MS but that we need to be more certain before beginning a medication.  Therefore, I will check an MRI of the brain.   If he also has lesions in a pattern consistent with MS we can be more certain of the diagnosis.  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  If the MRI is normal I would consider obtaining more information such as CSF.  Additionally we will check anti-NMO and anti-MOG to rule out MS mimics that would also need to be treated.  Because I believe he will most likely end up with a diagnosis of MS, we did discuss some treatment options and I had him sign a service request form for either Briumvi or Ocrevus as he is presenting aggressively with several Rebif relapses over the last year and multiple spinal cord lesions  If the sensory symptoms worsen we can start gabapentin 300 mg p.o. 3 times daily.  He will return to see me in 4 months for regularly scheduled visit or based on the results of the studies or if there are new or worsening symptoms will call us sooner.    Jahlisa Rossitto A. Epimenio Foot, MD, [facility] 03/09/2024, 9:20 AM Certified in Neurology, Clinical Neurophysiology, Sleep Medicine and Neuroimaging  [facility] [address] [place], [place] [address] [phone]

## 2024-03-07 ENCOUNTER — Telehealth: Payer: Self-pay

## 2024-03-07 NOTE — Telephone Encounter (Signed)
 Called patient to discuss pathology results and treatment plan. N/A. LMOVM to return our call.

## 2024-03-07 NOTE — Telephone Encounter (Signed)
Patient advised and scheduled for surgery. aw

## 2024-03-07 NOTE — Telephone Encounter (Signed)
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   Explanation: Your biopsy shows an atypical mole. It looks unusual enough that it cannot be distinguished from an early melanoma skin cancer. The safest course of action is to ensure it is fully removed with a surgery.  Treatment: you return for an hour long appointment where we perform a skin surgery. We numb the site of the skin cancer and a safety margin of normal skin around it. We remove the full thickness of skin and close the wound with two layers of stitches. The sample is sent to the lab to check that the skin cancer was fully removed. Return one week later to have wound checked and surface stitches removed. Surgical wound leaves a line scar.

## 2024-03-09 ENCOUNTER — Ambulatory Visit (INDEPENDENT_AMBULATORY_CARE_PROVIDER_SITE_OTHER)

## 2024-03-09 ENCOUNTER — Telehealth: Payer: Self-pay | Admitting: Neurology

## 2024-03-09 ENCOUNTER — Ambulatory Visit (INDEPENDENT_AMBULATORY_CARE_PROVIDER_SITE_OTHER): Payer: Self-pay | Admitting: Neurology

## 2024-03-09 ENCOUNTER — Encounter: Payer: Self-pay | Admitting: Neurology

## 2024-03-09 VITALS — BP 140/98 | HR 77 | Ht 67.0 in | Wt 223.0 lb

## 2024-03-09 DIAGNOSIS — Z79899 Other long term (current) drug therapy: Secondary | ICD-10-CM

## 2024-03-09 DIAGNOSIS — Z114 Encounter for screening for human immunodeficiency virus [HIV]: Secondary | ICD-10-CM | POA: Diagnosis not present

## 2024-03-09 DIAGNOSIS — G379 Demyelinating disease of central nervous system, unspecified: Secondary | ICD-10-CM | POA: Diagnosis not present

## 2024-03-09 DIAGNOSIS — G35 Multiple sclerosis: Secondary | ICD-10-CM

## 2024-03-09 DIAGNOSIS — R200 Anesthesia of skin: Secondary | ICD-10-CM

## 2024-03-09 HISTORY — DX: Multiple sclerosis: G35

## 2024-03-09 MED ORDER — GADOBENATE DIMEGLUMINE 529 MG/ML IV SOLN
20.0000 mL | Freq: Once | INTRAVENOUS | Status: AC | PRN
  Administered 2024-03-09: 20 mL via INTRAVENOUS

## 2024-03-09 NOTE — Telephone Encounter (Signed)
 I called to go over the results of the MRI but he got voicemail and left a message.  The MRI of the brain shows acute and chronic demyelinating plaque consistent with multiple sclerosis.  Therefore, I would like to proceed with getting him started on one of the anti-CD20 agents.  We will try to call him again tomorrow.

## 2024-03-10 MED ORDER — PREDNISONE 50 MG PO TABS: ORAL_TABLET | ORAL | 0 refills | Status: DC

## 2024-03-10 NOTE — Telephone Encounter (Signed)
 Per Dr. Epimenio Foot: "I spoke to the patient about the results.  I would like to go ahead and send in the form for Briumvi (Ocrevus as backup) to get the ball rolling as quick as possible.  Labs so far are fine."  Faxed completed/signed Briumvi start form to [facility] at [phone]. Received fax confirmation. Gave signed orders to [facility] below.

## 2024-03-10 NOTE — Telephone Encounter (Signed)
 I spoke to Sean Freeman about the MRI results.  He meets McDonald criteria for relapsing remitting MS.  We did get him started on the medication.  Due to multiple lesions in the spinal cord and multiple enhancing lesions in the brain, like to get her started on anti-CD20 agents.  Blood work so far looks good.  Specifically, Varicella-zoster antibodies are reactive, hep B is negative and IgG and IgM are in the normal range.  Because he had several small enhancing lesions in the brain, I will go ahead and send in a prescription for high-dose prednisone to hopefully help cover him better while we wait to get drug authorization.

## 2024-03-11 ENCOUNTER — Encounter: Payer: Self-pay | Admitting: Neurology

## 2024-03-14 ENCOUNTER — Other Ambulatory Visit: Payer: Self-pay | Admitting: Family Medicine

## 2024-03-14 DIAGNOSIS — M5412 Radiculopathy, cervical region: Secondary | ICD-10-CM

## 2024-03-15 LAB — QUANTIFERON-TB GOLD PLUS
QuantiFERON Mitogen Value: >10 [IU]/mL
QuantiFERON Nil Value: 0.02 [IU]/mL
QuantiFERON TB1 Ag Value: 0.03 [IU]/mL
QuantiFERON TB2 Ag Value: 0.03 [IU]/mL
QuantiFERON-TB Gold Plus: NEGATIVE

## 2024-03-15 LAB — VARICELLA ZOSTER ANTIBODY, IGG: Varicella zoster IgG: REACTIVE

## 2024-03-15 LAB — COMPREHENSIVE METABOLIC PANEL
ALT: 35 IU/L (ref 0–44)
AST: 26 IU/L (ref 0–40)
Albumin: 4.7 g/dL (ref 4.1–5.1)
Alkaline Phosphatase: 122 IU/L — ABNORMAL HIGH (ref 44–121)
BUN/Creatinine Ratio: 13 (ref 9–20)
BUN: 12 mg/dL (ref 6–20)
Bilirubin Total: 0.3 mg/dL (ref 0.0–1.2)
CO2: 21 mmol/L (ref 20–29)
Calcium: 10.2 mg/dL (ref 8.7–10.2)
Chloride: 106 mmol/L (ref 96–106)
Creatinine, Ser: 0.93 mg/dL (ref 0.76–1.27)
Globulin, Total: 2.5 g/dL (ref 1.5–4.5)
Glucose: 90 mg/dL (ref 70–99)
Potassium: 4.6 mmol/L (ref 3.5–5.2)
Sodium: 139 mmol/L (ref 134–144)
Total Protein: 7.2 g/dL (ref 6.0–8.5)
eGFR: 108 mL/min/{1.73_m2} (ref >59)

## 2024-03-15 LAB — CBC WITH DIFFERENTIAL/PLATELET
Basophils Absolute: 0 10*3/uL (ref 0.0–0.2)
Basos: 1 %
EOS (ABSOLUTE): 0.1 10*3/uL (ref 0.0–0.4)
Eos: 2 %
Hematocrit: 46.4 % (ref 37.5–51.0)
Hemoglobin: 15.9 g/dL (ref 13.0–17.7)
Immature Grans (Abs): 0 10*3/uL (ref 0.0–0.1)
Immature Granulocytes: 0 %
Lymphocytes Absolute: 1.5 10*3/uL (ref 0.7–3.1)
Lymphs: 29 %
MCH: 29.4 pg (ref 26.6–33.0)
MCHC: 34.3 g/dL (ref 31.5–35.7)
MCV: 86 fL (ref 79–97)
Monocytes Absolute: 0.4 10*3/uL (ref 0.1–0.9)
Monocytes: 7 %
Neutrophils Absolute: 3.2 10*3/uL (ref 1.4–7.0)
Neutrophils: 61 %
Platelets: 282 10*3/uL (ref 150–450)
RBC: 5.41 x10E6/uL (ref 4.14–5.80)
RDW: 13.3 % (ref 11.6–15.4)
WBC: 5.3 10*3/uL (ref 3.4–10.8)

## 2024-03-15 LAB — IGG, IGA, IGM
IgA/Immunoglobulin A, Serum: 256 mg/dL (ref 90–386)
IgG (Immunoglobin G), Serum: 1188 mg/dL (ref 603–1613)
IgM (Immunoglobulin M), Srm: 54 mg/dL (ref 20–172)

## 2024-03-15 LAB — ANTI-MOG, SERUM: MOG Antibody, Cell-based IFA: NEGATIVE

## 2024-03-15 LAB — HEPATITIS B SURFACE ANTIGEN: Hepatitis B Surface Ag: NEGATIVE

## 2024-03-15 LAB — HIV ANTIBODY (ROUTINE TESTING W REFLEX): HIV Screen 4th Generation wRfx: NONREACTIVE

## 2024-03-15 LAB — HEPATITIS B CORE ANTIBODY, TOTAL: Hep B Core Total Ab: NEGATIVE

## 2024-03-15 MED ORDER — GABAPENTIN 300 MG PO CAPS: 300.0000 mg | ORAL_CAPSULE | Freq: Every day | ORAL | 0 refills | Status: DC

## 2024-03-16 ENCOUNTER — Other Ambulatory Visit: Payer: Self-pay | Admitting: Family Medicine

## 2024-03-16 DIAGNOSIS — M5412 Radiculopathy, cervical region: Secondary | ICD-10-CM

## 2024-03-17 NOTE — Telephone Encounter (Signed)
 Requested Prescriptions  Pending Prescriptions Disp Refills   celecoxib (CELEBREX) 100 MG capsule [Pharmacy Med Name: CELECOXIB 100 MG CAPSULE] 60 capsule 2    Sig: TAKE 1 CAPSULE BY MOUTH 2 TIMES DAILY AS NEEDED.     Analgesics:  COX2 Inhibitors Failed - 03/17/2024 12:23 PM      Failed - Manual Review: Labs are only required if the patient has taken medication for more than 8 weeks.      Failed - Valid encounter within last 12 months    Recent Outpatient Visits           4 weeks ago Mixed hyperlipidemia   [facility], Ocie Bob, MD       Future Appointments             In 2 weeks Ashley Royalty, Ocie Bob, MD [facility] at [facility], [facility]   In 8 months Ashley Royalty, Ocie Bob, MD [facility] at [facility], [facility]            Passed - HGB in normal range and within 360 days    Hemoglobin  Date Value Ref Range Status  03/09/2024 15.9 13.0 - 17.7 g/dL Final         Passed - Cr in normal range and within 360 days    Creatinine, Ser  Date Value Ref Range Status  03/09/2024 0.93 0.76 - 1.27 mg/dL Final         Passed - HCT in normal range and within 360 days    Hematocrit  Date Value Ref Range Status  03/09/2024 46.4 37.5 - 51.0 % Final         Passed - AST in normal range and within 360 days    AST  Date Value Ref Range Status  03/09/2024 26 0 - 40 IU/L Final         Passed - ALT in normal range and within 360 days    ALT  Date Value Ref Range Status  03/09/2024 35 0 - 44 IU/L Final         Passed - eGFR is 30 or above and within 360 days    eGFR  Date Value Ref Range Status  03/09/2024 108 >59 mL/min/1.73 Final         Passed - Patient is not pregnant

## 2024-03-23 ENCOUNTER — Telehealth: Payer: Self-pay | Admitting: Neurology

## 2024-03-23 DIAGNOSIS — M5412 Radiculopathy, cervical region: Secondary | ICD-10-CM

## 2024-03-23 MED ORDER — GABAPENTIN 300 MG PO CAPS: 300.0000 mg | ORAL_CAPSULE | Freq: Three times a day (TID) | ORAL | 5 refills | Status: DC

## 2024-03-23 NOTE — Telephone Encounter (Signed)
 Pt is waiting to hear from Dr Epimenio Foot regarding what direction will be taken based on recent results, he states he has not been contacted by anyone, please advise, he states there has been new symptoms he'd like a call to discuss.

## 2024-03-23 NOTE — Telephone Encounter (Signed)
 Spoke w/ [facility]. They provided the following updates:    I asked the following f/u questions:

## 2024-03-23 NOTE — Telephone Encounter (Signed)
 Called and spoke w/ pt. Relayed Dr. Bonnita Hollow recommendation. He does not feel sx severe enough to do high dose oral steroids again. He would like to monitor and if they worsen, he will call back to have rx called in.  He read in notes from Dr. Epimenio Foot: "If the sensory symptoms worsen we can start gabapentin 300 mg p.o. 3 times daily." He would like to try this. I called in rx to [facility] per pt request.   Aware infusion suite will f/u with him once they get approval to schedule Briumvi.

## 2024-03-23 NOTE — Telephone Encounter (Signed)
 Called pt back. Relayed lab results per Dr. Bonnita Hollow note: "The lab work looks good'. He verbalized understanding. I relayed infusion suite still working on getting Briumvi approved via insurance. They will call to schedule once approved.   He reports the following sx: Intermittent numbness in hands/tingling when he bends neck that has worsened in last few days. Feels like fingernails are going to pop off, feels like a lot of pressure. Tingling occurs when he sits and looks at feet.  Also having dizziness. This is new since visit. Dizziness intermittent when he looks up or down quickly. Feels may be r/t to seasonal allergies.   No double vision/blurry vision. No UTI. No recent illness. No other new meds.  He finished oral prednisone 03/19/24. Confirmed he is taking baclofen 20mg , 1 po TID and gabapentin 100mg  q am and 300mg  po at bedtime.   Aware I will send to MD to make recommendation and then will call back to discuss.

## 2024-03-24 NOTE — Telephone Encounter (Signed)
 Per [place], pt scheduled for initiation doses of Briumvi 04/18/24 an 05/02/24.

## 2024-03-28 ENCOUNTER — Telehealth: Payer: Self-pay

## 2024-03-28 NOTE — Telephone Encounter (Signed)
 Patient called he was recently diagnosed with MS and he will start infusions this month he would like to postpone his surgery for the Dysplastic nevus-severe until May.   Ok surgery appt scheduled here  May 04, 2024 at 8:30 am

## 2024-03-31 ENCOUNTER — Telehealth: Payer: BC Managed Care – PPO | Admitting: Family Medicine

## 2024-04-09 LAB — COMPREHENSIVE METABOLIC PANEL WITH GFR
ALT: 35 IU/L (ref 0–44)
AST: 22 IU/L (ref 0–40)
Albumin: 4.4 g/dL (ref 4.1–5.1)
Alkaline Phosphatase: 118 IU/L (ref 44–121)
BUN/Creatinine Ratio: 14 (ref 9–20)
BUN: 15 mg/dL (ref 6–20)
Bilirubin Total: 0.7 mg/dL (ref 0.0–1.2)
CO2: 19 mmol/L — ABNORMAL LOW (ref 20–29)
Calcium: 9.6 mg/dL (ref 8.7–10.2)
Chloride: 104 mmol/L (ref 96–106)
Creatinine, Ser: 1.11 mg/dL (ref 0.76–1.27)
Globulin, Total: 2.2 g/dL (ref 1.5–4.5)
Glucose: 94 mg/dL (ref 70–99)
Potassium: 4.4 mmol/L (ref 3.5–5.2)
Sodium: 139 mmol/L (ref 134–144)
Total Protein: 6.6 g/dL (ref 6.0–8.5)
eGFR: 87 mL/min/{1.73_m2} (ref >59)

## 2024-04-09 LAB — LIPID PANEL
Chol/HDL Ratio: 4 ratio (ref 0.0–5.0)
Cholesterol, Total: 145 mg/dL (ref 100–199)
HDL: 36 mg/dL — ABNORMAL LOW (ref >39)
LDL Chol Calc (NIH): 80 mg/dL (ref 0–99)
Triglycerides: 167 mg/dL — ABNORMAL HIGH (ref 0–149)
VLDL Cholesterol Cal: 29 mg/dL (ref 5–40)

## 2024-04-12 ENCOUNTER — Telehealth (INDEPENDENT_AMBULATORY_CARE_PROVIDER_SITE_OTHER): Admitting: Family Medicine

## 2024-04-12 ENCOUNTER — Encounter: Payer: Self-pay | Admitting: Family Medicine

## 2024-04-12 DIAGNOSIS — G35 Multiple sclerosis: Secondary | ICD-10-CM | POA: Diagnosis not present

## 2024-04-12 DIAGNOSIS — E559 Vitamin D deficiency, unspecified: Secondary | ICD-10-CM | POA: Insufficient documentation

## 2024-04-12 DIAGNOSIS — J329 Chronic sinusitis, unspecified: Secondary | ICD-10-CM | POA: Insufficient documentation

## 2024-04-12 DIAGNOSIS — E782 Mixed hyperlipidemia: Secondary | ICD-10-CM

## 2024-04-12 DIAGNOSIS — G35A Relapsing-remitting multiple sclerosis: Secondary | ICD-10-CM | POA: Insufficient documentation

## 2024-04-12 MED ORDER — AZITHROMYCIN 250 MG PO TABS: ORAL_TABLET | ORAL | 0 refills | Status: AC

## 2024-04-12 MED ORDER — ATORVASTATIN CALCIUM 10 MG PO TABS: 10.0000 mg | ORAL_TABLET | Freq: Every day | ORAL | 3 refills | Status: DC

## 2024-04-12 MED ORDER — PROMETHAZINE-DM 6.25-15 MG/5ML PO SYRP: 5.0000 mL | ORAL_SOLUTION | Freq: Four times a day (QID) | ORAL | 0 refills | Status: DC | PRN

## 2024-04-12 MED ORDER — AZELASTINE HCL 0.1 % NA SOLN: 2.0000 | Freq: Two times a day (BID) | NASAL | 1 refills | Status: DC

## 2024-04-12 NOTE — Assessment & Plan Note (Signed)
 He is currently taking atorvastatin  without adverse effects, unlike previous cholesterol medication (rosuvastatin ) that caused rashes.   Hyperlipidemia Hyperlipidemia well-managed with atorvastatin  and lifestyle modifications. No adverse effects from atorvastatin . Current regimen effective with improved lab results. - Continue current atorvastatin  regimen.

## 2024-04-12 NOTE — Assessment & Plan Note (Signed)
 Observational studies and meta-analyses show that higher serum 25(OH)D levels are associated with reduced MS relapse rates and MRI lesion burden. Mowry et al., JAMA Neurol. 2014: Each 10 ng/mL increase in 25(OH)D linked to ~15% lower relapse risk.  - Initial: 4,000 IU/day of vitamin D3 - Plan to check at CPE

## 2024-04-12 NOTE — Assessment & Plan Note (Signed)
 He is experiencing symptoms of a sinus infection that began on Thursday, April 07, 2024. Symptoms include facial soreness, dark green and bloody nasal discharge, and a cough. No fever is present. He has been using nasal spray twice daily but continues to experience symptoms.  Acute rhinosinusitis Acute sinusitis with dark green and bloody nasal discharge, facial soreness, and cough for 6 days. Annual spring sinus infections. Upcoming Briumvi infusions for multiple sclerosis require careful infection management. - Prescribe azithromycin  (Z-Pak) if symptoms persist by Friday or Saturday without improvement. - Prescribe Astelin  nasal antihistamine twice daily. - Switch to Flonase Sensimist, Nasacort, or Rhinocort to prevent skin dryness. - Start oral antihistamine such as Claritin, Zyrtec, or Allegra. - Recommend Mucinex (guaifenesin) twice daily. - Prescribe Rx cough syrup for as-needed use.  - Advise rest and supportive care.

## 2024-04-12 NOTE — Progress Notes (Signed)
 Primary Care / Sports Medicine Virtual Visit  Patient Information:  Patient ID: Sean Freeman, male DOB: Aug 21, 1986 Age: 38 y.o. MRN: [id number]   Sean Freeman is a pleasant 38 y.o. male presenting with the following:  Chief Complaint  Patient presents with   Follow-up    Lab work    Review of Systems: No fevers, chills, night sweats, weight loss, chest pain, or shortness of breath.   Patient Active Problem List   Diagnosis Date Noted   Vitamin D  deficiency 04/12/2024   Multiple sclerosis (HCC) 04/12/2024   Rhinosinusitis 04/12/2024   Scalp cyst 11/09/2023   Other spondylosis with radiculopathy, cervical region 08/21/2023   Mixed hyperlipidemia 06/05/2022   Family history of premature coronary heart disease 06/05/2022   Right lateral epicondylitis 06/05/2022   Healthcare maintenance 03/14/2022   Gastroesophageal reflux disease 03/14/2022   Patellofemoral syndrome, bilateral 02/13/2022   SI (sacroiliac) pain 02/13/2022   Need for tetanus, diphtheria, and acellular pertussis (Tdap) vaccine 02/13/2022   Lumbosacral spondylosis with radiculopathy 02/13/2022   Past Medical History:  Diagnosis Date   Allergy    Dysplastic nevus 02/29/2024   Left spinal upper back. Severe atypia, with focal scar, margin close. Excision pending   GERD (gastroesophageal reflux disease)    Hyperlipidemia    MS (multiple sclerosis) (HCC) 03/09/2024   Outpatient Encounter Medications as of 04/12/2024  Medication Sig   azelastine  (ASTELIN ) 0.1 % nasal spray Place 2 sprays into both nostrils 2 (two) times daily. Use in each nostril as directed   azithromycin  (ZITHROMAX ) 250 MG tablet Take 2 tablets on day 1, then 1 tablet daily on days 2 through 5   baclofen  (LIORESAL ) 20 MG tablet Take 1 tablet (20 mg total) by mouth 3 (three) times daily as needed for muscle spasms.   celecoxib  (CELEBREX ) 100 MG capsule TAKE 1 CAPSULE BY MOUTH 2 TIMES DAILY AS NEEDED.   gabapentin  (NEURONTIN ) 300 MG  capsule Take 1 capsule (300 mg total) by mouth 3 (three) times daily.   promethazine -dextromethorphan (PROMETHAZINE -DM) 6.25-15 MG/5ML syrup Take 5 mLs by mouth 4 (four) times daily as needed for cough.   [DISCONTINUED] atorvastatin  (LIPITOR) 10 MG tablet Take 1 tablet (10 mg total) by mouth daily.   atorvastatin  (LIPITOR) 10 MG tablet Take 1 tablet (10 mg total) by mouth daily.   [DISCONTINUED] predniSONE  (DELTASONE ) 50 MG tablet 12 tablets (600 mg) p.o. daily with food x 3 days   [DISCONTINUED] Vitamin D , Ergocalciferol , (DRISDOL ) 1.25 MG (50000 UNIT) CAPS capsule Take 1 capsule (50,000 Units total) by mouth every 7 (seven) days. Take for 8 total doses(weeks)   No facility-administered encounter medications on file as of 04/12/2024.   Past Surgical History:  Procedure Laterality Date   TYMPANOSTOMY TUBE PLACEMENT Bilateral     Virtual Visit via MyChart Video:   I connected with Sean Freeman on 04/12/24 via MyChart Video and verified that I am speaking with the correct person using appropriate identifiers.   The limitations, risks, security and privacy concerns of performing an evaluation and management service by MyChart Video, including the higher likelihood of inaccurate diagnoses and treatments, and the availability of in person appointments were reviewed. The possible need of an additional face-to-face encounter for complete and high quality delivery of care was discussed. The patient was also made aware that there may be a patient responsible charge related to this service. The patient expressed understanding and wishes to proceed.  Provider location is in medical facility. Patient  location is at their work, different from provider location. People involved in care of the patient during this telehealth encounter were myself, my nurse/medical assistant, and my front office/scheduling team member.  Objective findings:   General: Speaking full sentences, no audible heavy breathing.  Sounds alert and appropriately interactive. Well-appearing. Face symmetric. Extraocular movements intact. Pupils equal and round. No nasal flaring or accessory muscle use visualized.  Independent interpretation of notes and tests performed by another provider:   None  Pertinent History, Exam, Impression, and Recommendations:   Problem List Items Addressed This Visit     Mixed hyperlipidemia   He is currently taking atorvastatin  without adverse effects, unlike previous cholesterol medication (rosuvastatin ) that caused rashes.   Hyperlipidemia Hyperlipidemia well-managed with atorvastatin  and lifestyle modifications. No adverse effects from atorvastatin . Current regimen effective with improved lab results. - Continue current atorvastatin  regimen.      Relevant Medications   atorvastatin  (LIPITOR) 10 MG tablet   Multiple sclerosis (HCC)   He has a confirmed diagnosis of multiple sclerosis, identified through MRI findings of brain lesions. His symptoms have been minimal, primarily involving numbness in the hands and feet. He is also on gabapentin , initially prescribed at 300 mg at night, but now increased to 300 mg three times a day for numbness in the hands and feet through neurology.  Multiple sclerosis Recently diagnosed multiple sclerosis with brain lesions confirmed by MRI. Symptoms improved with prednisone  and gabapentin . Scheduled Briumvi infusions to slow progression. Approved for copay assistance. Treatment aims to manage symptoms and prevent progression while monitoring for infections due to immunosuppressive therapy. - Continue management and follow-up with neurology. - Coordinate with Dr. Thom Fleeting office for insurance issues on gabapentin  dosing to 300 mg three times daily. - Continue gabapentin  as prescribed until insurance issues are resolved.       Rhinosinusitis - Primary   He is experiencing symptoms of a sinus infection that began on Thursday, April 07, 2024. Symptoms  include facial soreness, dark green and bloody nasal discharge, and a cough. No fever is present. He has been using nasal spray twice daily but continues to experience symptoms.  Acute rhinosinusitis Acute sinusitis with dark green and bloody nasal discharge, facial soreness, and cough for 6 days. Annual spring sinus infections. Upcoming Briumvi infusions for multiple sclerosis require careful infection management. - Prescribe azithromycin  (Z-Pak) if symptoms persist by Friday or Saturday without improvement. - Prescribe Astelin  nasal antihistamine twice daily. - Switch to Flonase Sensimist, Nasacort, or Rhinocort to prevent skin dryness. - Start oral antihistamine such as Claritin, Zyrtec, or Allegra. - Recommend Mucinex (guaifenesin) twice daily. - Prescribe Rx cough syrup for as-needed use.  - Advise rest and supportive care.      Relevant Medications   azelastine  (ASTELIN ) 0.1 % nasal spray   promethazine -dextromethorphan (PROMETHAZINE -DM) 6.25-15 MG/5ML syrup   azithromycin  (ZITHROMAX ) 250 MG tablet   Vitamin D  deficiency   Observational studies and meta-analyses show that higher serum 25(OH)D levels are associated with reduced MS relapse rates and MRI lesion burden. Mowry et al., JAMA Neurol. 2014: Each 10 ng/mL increase in 25(OH)D linked to ~15% lower relapse risk.  - Initial: 4,000 IU/day of vitamin D3 - Plan to check at CPE        Orders & Medications Medications:  Meds ordered this encounter  Medications   atorvastatin  (LIPITOR) 10 MG tablet    Sig: Take 1 tablet (10 mg total) by mouth daily.    Dispense:  90 tablet    Refill:  3   azelastine  (ASTELIN ) 0.1 % nasal spray    Sig: Place 2 sprays into both nostrils 2 (two) times daily. Use in each nostril as directed    Dispense:  30 mL    Refill:  1    Use generic Astelin    promethazine -dextromethorphan (PROMETHAZINE -DM) 6.25-15 MG/5ML syrup    Sig: Take 5 mLs by mouth 4 (four) times daily as needed for cough.     Dispense:  118 mL    Refill:  0   azithromycin  (ZITHROMAX ) 250 MG tablet    Sig: Take 2 tablets on day 1, then 1 tablet daily on days 2 through 5    Dispense:  6 tablet    Refill:  0   No orders of the defined types were placed in this encounter.    I discussed the above assessment and treatment plan with the patient. The patient was provided an opportunity to ask questions and all were answered. The patient agreed with the plan and demonstrated an understanding of the instructions.   The patient was advised to call back or seek an in-person evaluation if the symptoms worsen or if the condition fails to improve as anticipated.   I provided a total time of 30 minutes including both face-to-face and non-face-to-face time on 04/12/2024 inclusive of time utilized for medical chart review, information gathering, care coordination with staff, and documentation completion.    Ma Saupe, MD, [facility]   Primary Care Sports Medicine Primary Care and Sports Medicine at [facility]

## 2024-04-12 NOTE — Patient Instructions (Signed)
 Patient Plan for Post-Visit Guidance  1. Mixed Hyperlipidemia Management:    - Continue taking atorvastatin  as prescribed in addition to lifestyle changes.  2. Multiple Sclerosis Management:    - Continue gabapentin  at 300 mg three times daily for numbness.    - Coordinate with Dr. Thom Fleeting office if there are insurance issues with gabapentin .    - Follow up with neurology as scheduled.    - Be aware of potential infections due to ongoing immunosuppressive therapy.  3. Acute Rhinosinusitis Management:    - Use Astelin  nasal spray twice daily.    - Switch to Flonase Sensimist, Nasacort, or Rhinocort to prevent intranasal skin dryness.    - Take an oral antihistamine such as Claritin, Zyrtec, or Allegra.    - Take Mucinex (guaifenesin) twice daily.    - Use prescription cough syrup as needed.    - Rest and follow supportive care measures.    - If symptoms persist without improvement by Friday or Saturday, start azithromycin  (Z-Pak).  4. Vitamin D  Deficiency Management:    - Take 4,000 IU of vitamin D3 daily.    - Plan to check vitamin D  levels at your next comprehensive physical exam (CPE).  Red Flags: - If you experience any significant worsening of symptoms or new symptoms, contact your healthcare provider promptly.  Please follow these steps and reach out if you have questions or need further assistance.

## 2024-04-12 NOTE — Assessment & Plan Note (Signed)
 He has a confirmed diagnosis of multiple sclerosis, identified through MRI findings of brain lesions. His symptoms have been minimal, primarily involving numbness in the hands and feet. He is also on gabapentin , initially prescribed at 300 mg at night, but now increased to 300 mg three times a day for numbness in the hands and feet through neurology.  Multiple sclerosis Recently diagnosed multiple sclerosis with brain lesions confirmed by MRI. Symptoms improved with prednisone  and gabapentin . Scheduled Briumvi infusions to slow progression. Approved for copay assistance. Treatment aims to manage symptoms and prevent progression while monitoring for infections due to immunosuppressive therapy. - Continue management and follow-up with neurology. - Coordinate with Dr. Thom Fleeting office for insurance issues on gabapentin  dosing to 300 mg three times daily. - Continue gabapentin  as prescribed until insurance issues are resolved.

## 2024-04-20 ENCOUNTER — Encounter: Admitting: Dermatology

## 2024-04-25 ENCOUNTER — Encounter: Payer: Self-pay | Admitting: Neurology

## 2024-04-25 NOTE — Telephone Encounter (Signed)
 Patient's first Briumvi infusion was 04/18/2024.

## 2024-05-04 ENCOUNTER — Ambulatory Visit: Admitting: Dermatology

## 2024-05-04 ENCOUNTER — Telehealth: Payer: Self-pay

## 2024-05-04 ENCOUNTER — Encounter: Payer: Self-pay | Admitting: Dermatology

## 2024-05-04 DIAGNOSIS — D239 Other benign neoplasm of skin, unspecified: Secondary | ICD-10-CM

## 2024-05-04 DIAGNOSIS — D235 Other benign neoplasm of skin of trunk: Secondary | ICD-10-CM | POA: Diagnosis not present

## 2024-05-04 MED ORDER — MUPIROCIN 2 % EX OINT: 1.0000 | TOPICAL_OINTMENT | Freq: Every day | CUTANEOUS | 0 refills | Status: DC

## 2024-05-04 NOTE — Telephone Encounter (Signed)
Patient doing well after today's surgery./sh 

## 2024-05-04 NOTE — Progress Notes (Addendum)
   Follow-Up Visit   Subjective  Sean Freeman is a 38 y.o. male who presents for the following: Excision of Severe dysplastic nevus bx proven, L spinal upper back    The following portions of the chart were reviewed this encounter and updated as appropriate: medications, allergies, medical history  Review of Systems:  No other skin or systemic complaints except as noted in HPI or Assessment and Plan.  Objective  Well appearing patient in no apparent distress; mood and affect are within normal limits.   A focused examination was performed of the following areas: back  Relevant exam findings are noted in the Assessment and Plan.  L spinal upper back Pink bx site  Assessment & Plan   DYSPLASTIC NEVUS L spinal upper back Bx proven Severe Dysplastic Nevus Start Mupirocin oint qd to excision site Skin excision - L spinal upper back  Excision method:  elliptical Lesion length (cm):  0.8 Margin per side (cm):  0.4 Total excision diameter (cm):  1.6 Informed consent: discussed and consent obtained   Timeout: patient name, date of birth, surgical site, and procedure verified   Procedure prep:  Patient was prepped and draped in usual sterile fashion Prep type:  Chlorhexidine Anesthesia: the lesion was anesthetized in a standard fashion   Anesthetic:  1% lidocaine w/ epinephrine 1-100,000 buffered w/ 8.4% NaHCO3 (11.5cc) Instrument used: #15 blade   Hemostasis achieved with: suture, pressure and electrodesiccation   Outcome: patient tolerated procedure well with no complications    Skin repair - L spinal upper back Complexity:  Intermediate Final length (cm):  5.8 Informed consent: discussed and consent obtained   Timeout: patient name, date of birth, surgical site, and procedure verified   Procedure prep:  Patient was prepped and draped in usual sterile fashion Prep type:  Chlorhexidine Anesthesia: the lesion was anesthetized in a standard fashion   Anesthetic:  1%  lidocaine w/ epinephrine 1-100,000 buffered w/ 8.4% NaHCO3 Reason for type of repair: reduce tension to allow closure, reduce the risk of dehiscence, infection, and necrosis, reduce subcutaneous dead space and avoid a hematoma, allow closure of the large defect and preserve normal anatomy   Undermining: edges could be approximated without difficulty   Subcutaneous layers (deep stitches):  Suture size:  3-0 Suture type: Vicryl (polyglactin 910) and Monocryl (poliglecaprone 25)   Stitches:  Buried vertical mattress Fine/surface layer approximation (top stitches):  Suture size:  4-0 Suture type: Vicryl (polyglactin 910) and Prolene (polypropylene)   Stitches comment:  Runnning locked Suture removal (days):  14 Hemostasis achieved with: suture, pressure and electrodesiccation Outcome: patient tolerated procedure well with no complications   Post-procedure details: sterile dressing applied and wound care instructions given   Dressing type: bandage, pressure dressing and bacitracin    mupirocin ointment (BACTROBAN) 2 % - L spinal upper back Apply 1 Application topically daily. qd to excision site Specimen 1 - Surgical pathology Differential Diagnosis: Bx proven Severe Dysplastic Nevus  Check Margins: yes Pink bx site 1.5cm [id number] Superior tag   Return in about 2 weeks (around 05/18/2024) for suture removal, 70m TBSE.  I, Rollie Clipper, RMA, am acting as scribe for Harris Liming, MD .   Documentation: I have reviewed the above documentation for accuracy and completeness, and I agree with the above.  Harris Liming, MD

## 2024-05-04 NOTE — Patient Instructions (Signed)

## 2024-05-05 ENCOUNTER — Other Ambulatory Visit: Payer: Self-pay | Admitting: Family Medicine

## 2024-05-05 DIAGNOSIS — J329 Chronic sinusitis, unspecified: Secondary | ICD-10-CM

## 2024-05-09 NOTE — Telephone Encounter (Signed)
 Requested medication (s) are due for refill today: no  Requested medication (s) are on the active medication list: yes  Last refill:  04/12/24 30 ml 1 RF  Future visit scheduled: yes  Notes to clinic:  requesting 90 day refill   Requested Prescriptions  Pending Prescriptions Disp Refills   Azelastine  HCl 137 MCG/SPRAY SOLN [Pharmacy Med Name: AZELASTINE  0.1% (137 MCG) SPRY]  1    Sig: PLACE 2 SPRAYS INTO BOTH NOSTRILS 2 (TWO) TIMES DAILY. USE IN EACH NOSTRIL AS DIRECTED     Ear, Nose, and Throat: Nasal Preparations - Antiallergy Passed - 05/09/2024 10:59 AM      Passed - Valid encounter within last 12 months    Recent Outpatient Visits           3 weeks ago Rhinosinusitis   [place] [facility], Dessie Flow, MD   2 months ago Mixed hyperlipidemia   [facility] Primary Care & Sports Medicine at [facility], Dessie Flow, MD       Future Appointments             In 6 months Harris Liming, MD [facility]   In 6 months Augustus Ledger, Dessie Flow, MD [facility] Primary Care & Sports Medicine at [facility], [facility]

## 2024-05-10 LAB — SURGICAL PATHOLOGY

## 2024-05-10 NOTE — Telephone Encounter (Signed)
 Please sign if appropriate.   JM

## 2024-05-11 ENCOUNTER — Ambulatory Visit: Payer: Self-pay | Admitting: Dermatology

## 2024-05-11 NOTE — Telephone Encounter (Signed)
 Discussed pathology results with patient. Surgical site is healing well.

## 2024-05-11 NOTE — Telephone Encounter (Signed)
-----   Message from [place] sent at 05/11/2024  9:42 AM EDT ----- Diagnosis L spinal upper back :       NO RESIDUAL DYSPLASTIC NEVUS, MARGINS FREE    Please call to share that excision was clear of abnormal mole and get update on surgical wound. Thank you.

## 2024-05-19 ENCOUNTER — Encounter: Payer: Self-pay | Admitting: Dermatology

## 2024-05-19 ENCOUNTER — Ambulatory Visit: Admitting: Dermatology

## 2024-05-19 ENCOUNTER — Telehealth: Payer: Self-pay

## 2024-05-19 DIAGNOSIS — Z4802 Encounter for removal of sutures: Secondary | ICD-10-CM

## 2024-05-19 DIAGNOSIS — M6740 Ganglion, unspecified site: Secondary | ICD-10-CM

## 2024-05-19 DIAGNOSIS — Z5189 Encounter for other specified aftercare: Secondary | ICD-10-CM

## 2024-05-19 DIAGNOSIS — Z48817 Encounter for surgical aftercare following surgery on the skin and subcutaneous tissue: Secondary | ICD-10-CM

## 2024-05-19 NOTE — Telephone Encounter (Signed)
 Patient would like referral for ganglion cyst at right dorsal hand/wrist with history of two injuries to the affected area. Has become sore and gotten larger. Sue Em., RMA

## 2024-05-19 NOTE — Progress Notes (Signed)
   Follow-Up Visit   Subjective  Sean Freeman is a 38 y.o. male who presents for the following: Suture removal  Pathology showed NO RESIDUAL DYSPLASTIC NEVUS, MARGINS FREE    The following portions of the chart were reviewed this encounter and updated as appropriate: medications, allergies, medical history  Review of Systems:  No other skin or systemic complaints except as noted in HPI or Assessment and Plan.  Objective  Well appearing patient in no apparent distress; mood and affect are within normal limits.  Areas Examined: back Relevant physical exam findings are noted in the Assessment and Plan.    Assessment & Plan   VISIT FOR WOUND CHECK   ENCOUNTER FOR REMOVAL OF SUTURES   Encounter for Removal of Sutures - Incision site is clean, dry and intact. - Wound cleansed, sutures removed, wound cleansed and steri strips applied.  - Discussed pathology results showing NO RESIDUAL DYSPLASTIC NEVUS, MARGINS FREE at left spinal upper back  - Patient advised to keep steri-strips dry until they fall off. - Scars remodel for a full year. - Once steri-strips fall off, patient can apply over-the-counter silicone scar cream once to twice a day to help with scar remodeling if desired. - Patient advised to call with any concerns or if they notice any new or changing lesions.  Return for next scheduled.  Kerstin Peeling, RMA, am acting as scribe for Harris Liming, MD .   Documentation: I have reviewed the above documentation for accuracy and completeness, and I agree with the above.  Harris Liming, MD

## 2024-05-19 NOTE — Patient Instructions (Signed)

## 2024-05-31 ENCOUNTER — Encounter: Payer: Self-pay | Admitting: Neurology

## 2024-06-07 NOTE — Telephone Encounter (Signed)
 Please review

## 2024-06-08 ENCOUNTER — Encounter (HOSPITAL_BASED_OUTPATIENT_CLINIC_OR_DEPARTMENT_OTHER): Payer: Self-pay | Admitting: Orthopedic Surgery

## 2024-06-08 NOTE — Progress Notes (Addendum)
   06/08/24 1356  PAT Phone Screen  Do you have a history of heart problems? No  Cardiologist Name [facility] 06/30/22 w/ Dr Janus Mercury for SOB following Covid and  family hx of CAD  Have you ever had tests on your heart? Yes  What cardiac tests were performed? Echo;Other (comment)  What date/year were cardiac tests completed? Echo EF 60-65% normal anatomy 09/18/22, Coronary CTA= Ca+ score 0  Results viewable: [facility] [employer] Tab   Pt scheduled for surgery on 06/15/24. Reviewed above history and note with Dr Claudio Culver- Pt will need f/u visit or cards clearance prior to upcoming procedure Valinda Gault at Dr Theodoro Fisherman office notified.   Spoke w/ pt by phone. He needs to postpone surgery as he is on vacation that week.  I asked him about the follow up plan with Dr Junnie Olives. He said that after the Echo the Dr called with the results, and he was under the impression that no further follow up was indicated.  I encouraged him to reach out to the cardiology office to scheulde a f/u appt.  I left Sherry at @ Dr Theodoro Fisherman office a vm and asked her to give him a call to get him rescheduled for surgery.

## 2024-06-12 ENCOUNTER — Other Ambulatory Visit: Payer: Self-pay | Admitting: Family Medicine

## 2024-06-12 DIAGNOSIS — M5412 Radiculopathy, cervical region: Secondary | ICD-10-CM

## 2024-06-14 NOTE — Telephone Encounter (Signed)
 Requested medication (s) are due for refill today: na   Requested medication (s) are on the active medication list: yes   Last refill:  01/13/24 #270 0 refills  Future visit scheduled: yes in 5 months   Notes to clinic:  Pharmacy comment: REQUEST FOR 90 DAYS PRESCRIPTION. DX Code Needed.  Do you want to refill Rx?     Requested Prescriptions  Pending Prescriptions Disp Refills   baclofen  (LIORESAL ) 20 MG tablet [Pharmacy Med Name: BACLOFEN  20 MG TABLET] 270 tablet 1    Sig: TAKE 1 TABLET (20 MG TOTAL) BY MOUTH 3 (THREE) TIMES DAILY AS NEEDED FOR MUSCLE SPASMS.     Analgesics:  Muscle Relaxants - baclofen  Passed - 06/14/2024  1:52 PM      Passed - Cr in normal range and within 180 days    Creatinine, Ser  Date Value Ref Range Status  04/08/2024 1.11 0.76 - 1.27 mg/dL Final         Passed - eGFR is 30 or above and within 180 days    eGFR  Date Value Ref Range Status  04/08/2024 87 >59 mL/min/1.73 Final         Passed - Valid encounter within last 6 months    Recent Outpatient Visits           2 months ago Rhinosinusitis   [place] [facility], Selinda PARAS, MD   3 months ago Mixed hyperlipidemia   [facility] at [facility], Selinda PARAS, MD       Future Appointments             In 4 months Claudene Lehmann, MD [facility]   In 5 months Ku, Selinda PARAS, MD [facility] Primary Care & Sports Medicine at [facility], [facility]

## 2024-06-15 ENCOUNTER — Other Ambulatory Visit: Payer: Self-pay | Admitting: Family Medicine

## 2024-06-15 ENCOUNTER — Encounter (HOSPITAL_BASED_OUTPATIENT_CLINIC_OR_DEPARTMENT_OTHER): Admission: RE | Payer: Self-pay | Source: Home / Self Care

## 2024-06-15 ENCOUNTER — Ambulatory Visit (HOSPITAL_BASED_OUTPATIENT_CLINIC_OR_DEPARTMENT_OTHER): Admission: RE | Admit: 2024-06-15 | Source: Home / Self Care | Admitting: Orthopedic Surgery

## 2024-06-15 DIAGNOSIS — M5412 Radiculopathy, cervical region: Secondary | ICD-10-CM

## 2024-06-15 SURGERY — EXCISION, GANGLION CYST, WRIST
Anesthesia: Monitor Anesthesia Care | Site: Wrist | Laterality: Right

## 2024-06-16 NOTE — Telephone Encounter (Signed)
 Requested Prescriptions  Pending Prescriptions Disp Refills   celecoxib  (CELEBREX ) 100 MG capsule [Pharmacy Med Name: CELECOXIB  100 MG CAPSULE] 60 capsule 2    Sig: TAKE 1 CAPSULE BY MOUTH 2 TIMES DAILY AS NEEDED.     Analgesics:  COX2 Inhibitors Failed - 06/16/2024  1:49 PM      Failed - Manual Review: Labs are only required if the patient has taken medication for more than 8 weeks.      Passed - HGB in normal range and within 360 days    Hemoglobin  Date Value Ref Range Status  03/09/2024 15.9 13.0 - 17.7 g/dL Final         Passed - Cr in normal range and within 360 days    Creatinine, Ser  Date Value Ref Range Status  04/08/2024 1.11 0.76 - 1.27 mg/dL Final         Passed - HCT in normal range and within 360 days    Hematocrit  Date Value Ref Range Status  03/09/2024 46.4 37.5 - 51.0 % Final         Passed - AST in normal range and within 360 days    AST  Date Value Ref Range Status  04/08/2024 22 0 - 40 IU/L Final         Passed - ALT in normal range and within 360 days    ALT  Date Value Ref Range Status  04/08/2024 35 0 - 44 IU/L Final         Passed - eGFR is 30 or above and within 360 days    eGFR  Date Value Ref Range Status  04/08/2024 87 >59 mL/min/1.73 Final         Passed - Patient is not pregnant      Passed - Valid encounter within last 12 months    Recent Outpatient Visits           2 months ago Rhinosinusitis    [facility] Medicine at [facility], Selinda PARAS, MD   3 months ago Mixed hyperlipidemia   [facility] Care & Sports Medicine at [facility], Selinda PARAS, MD       Future Appointments             In 4 months Claudene Lehmann, MD [facility]   In 5 months Ku, Selinda PARAS, MD [facility] Primary Care & Sports Medicine at [facility], [facility]

## 2024-06-22 ENCOUNTER — Ambulatory Visit (INDEPENDENT_AMBULATORY_CARE_PROVIDER_SITE_OTHER): Admitting: Neurology

## 2024-06-22 ENCOUNTER — Encounter: Payer: Self-pay | Admitting: Neurology

## 2024-06-22 VITALS — BP 136/92 | HR 79

## 2024-06-22 DIAGNOSIS — Z79899 Other long term (current) drug therapy: Secondary | ICD-10-CM

## 2024-06-22 DIAGNOSIS — R200 Anesthesia of skin: Secondary | ICD-10-CM

## 2024-06-22 DIAGNOSIS — M67431 Ganglion, right wrist: Secondary | ICD-10-CM | POA: Diagnosis not present

## 2024-06-22 DIAGNOSIS — G35 Multiple sclerosis: Secondary | ICD-10-CM | POA: Diagnosis not present

## 2024-06-22 DIAGNOSIS — F418 Other specified anxiety disorders: Secondary | ICD-10-CM

## 2024-06-22 MED ORDER — SERTRALINE HCL 50 MG PO TABS: 50.0000 mg | ORAL_TABLET | Freq: Every day | ORAL | 3 refills | Status: DC

## 2024-06-22 NOTE — Progress Notes (Signed)
 [place]  [facility]  PATIENT: Sean Freeman DOB: Dec 01, 1986  REFERRING DOCTOR OR PCP: Selinda Ku, MD SOURCE: Patient, notes from [facility], imaging and lab reports, MRI images personally reviewed.  _________________________________   HISTORICAL  CHIEF COMPLAINT:  Chief Complaint  Patient presents with   Follow-up    Pt in room 11.alone. Here for MS follow up. DMT: Briumvi, Last infusion date: 05/02/2024,Next infusion date:10/03/2024. Patient reports well, no concerns. Due for eye exam this year.      HISTORY OF PRESENT ILLNESS:  Sean Freeman is a 38 y.o. man with relapsing remitting MS  UPDATE 06/22/2024: He started Briumvi with split infusion May 2025.  He felt poor after the first one and tolerated the second one better.  Next infusion in October 2025.  Gait is fine.  He goes downstairs without using the bannister.  Right foot drop improved.   He still has hand numbness and right leg numbness, better than last visit.  Hand numbness is sometimes painful.   He notes it more after he walks more.   He has a Lhermitte sign at times.   Symptoms are worse in heat and it makes him feel like he just had a workout.  Gabapentin  has helped dysesthesias some.   He takes baclofen  20 mg bid.  He notes mild urinary changes and has reduced stream and some urgency.    He denies nocturia more tan once.       Vision is doing well.  No diplopia.   Eyes sometimes are blurry in the morning but better back to baseline after a few minutes.  He denies changes in cognition .   He sleeps well for the most part though numbness sometimes awakens him.    He notes some fatigue but similar to last year.   He is noting more anxiety with his MS diagnosis and worries some about his future.    He is otherwise fairly healthy and stays active.  He was placed on Celebrex  and baclofen  for his neck pain.  He has mild hyperlipidemia.  He recently has had a skin excision of a dysplastic nevus and may  have a larger excision soon.  He has a right wrist ganglion cyst and may need surgery.      MS HISTORY He had the onset of numbness in his hands and neck pain in  mid 2024.  He saw Dr. Burnetta at [facility] and an MRI of the cervical spine showed mild to moderate multilevel spinal stenosis but also showed multiple T2 hyperintense foci concerning for demyelination.  He notes the numbness in the left chest one morning in mid July 2024.   He also noted sensation was a little different in the back.    Over a short period of time, he notes bilateral sensory symptoms and altered sensation in his feet.   He notes the dysesthesias increase with certain positions, especially at night.   He gets an electric jolt when he flexes his neck that goes all the way down to his legs.   He denies weakness.   However, he feels grip is altered due to reduced sensation.   The right foot will sometimes rag a bit.     He had leg numbness onset December 2024    Imaging  MRI of the cervical spine 08/2023 showed T2 hyperintense foci posterolaterally to the left adjacent to C2, posteriorly adjacent to C3, generally adjacent to C4, Posteriorly adjacent to C4-C5, posterolaterally to the right adjacent  to upper C5, posterolaterally to the left adjacent to T1.  Contrast was not given with this MRI.  Additionally, there are degenerative changes that are maximal at C5-C6.  There is mild spinal stenosis due to a right paramedian disc protrusion that also causes moderate right foraminal narrowing.  Brain MRI 03/09/2024 showed Scattered T2/FLAIR hyperintense foci in the cerebral hemispheres and left pons in a pattern consistent with demyelinating plaque associated with multiple sclerosis. 5 of the foci in the cerebral hemispheres enhance after the administration of contrast material consistent with acute demyelination. The combination of acute and chronic foci in the brain and the foci noted in the cervical spinal cord meets McDonald  criteria for multiple sclerosis.     REVIEW OF SYSTEMS: Constitutional: No fevers, chills, sweats, or change in appetite Eyes: No visual changes, double vision, eye pain Ear, nose and throat: No hearing loss, ear pain, nasal congestion, sore throat Cardiovascular: No chest pain, palpitations Respiratory:  No shortness of breath at rest or with exertion.   No wheezes GastrointestinaI: No nausea, vomiting, diarrhea, abdominal pain, fecal incontinence Genitourinary:  No dysuria, urinary retention or frequency.  No nocturia. Musculoskeletal:  No neck pain, back pain Integumentary: No rash, pruritus, skin lesions Neurological: as above Psychiatric: No depression at this time.  No anxiety Endocrine: No palpitations, diaphoresis, change in appetite, change in weigh or increased thirst Hematologic/Lymphatic:  No anemia, purpura, petechiae. Allergic/Immunologic: No itchy/runny eyes, nasal congestion, recent allergic reactions, rashes  ALLERGIES: Allergies  Allergen Reactions   Amoxicillin Hives   Penicillins Hives   Rosuvastatin  Rash and Dermatitis    HOME MEDICATIONS:  Current Outpatient Medications:    atorvastatin  (LIPITOR) 10 MG tablet, Take 1 tablet (10 mg total) by mouth daily., Disp: 90 tablet, Rfl: 3   baclofen  (LIORESAL ) 20 MG tablet, TAKE 1 TABLET (20 MG TOTAL) BY MOUTH 3 (THREE) TIMES DAILY AS NEEDED FOR MUSCLE SPASMS., Disp: 270 tablet, Rfl: 1   celecoxib  (CELEBREX ) 100 MG capsule, TAKE 1 CAPSULE BY MOUTH 2 TIMES DAILY AS NEEDED., Disp: 60 capsule, Rfl: 2   gabapentin  (NEURONTIN ) 300 MG capsule, Take 1 capsule (300 mg total) by mouth 3 (three) times daily., Disp: 90 capsule, Rfl: 5   mupirocin  ointment (BACTROBAN ) 2 %, Apply 1 Application topically daily. qd to excision site, Disp: 22 g, Rfl: 0   sertraline (ZOLOFT) 50 MG tablet, Take 1 tablet (50 mg total) by mouth daily., Disp: 90 tablet, Rfl: 3   Azelastine  HCl 137 MCG/SPRAY SOLN, PLACE 2 SPRAYS INTO BOTH NOSTRILS 2 (TWO)  TIMES DAILY. USE IN EACH NOSTRIL AS DIRECTED (Patient not taking: Reported on 06/22/2024), Disp: 30 mL, Rfl: 1   promethazine -dextromethorphan (PROMETHAZINE -DM) 6.25-15 MG/5ML syrup, Take 5 mLs by mouth 4 (four) times daily as needed for cough. (Patient not taking: Reported on 06/22/2024), Disp: 118 mL, Rfl: 0  PAST MEDICAL HISTORY: Past Medical History:  Diagnosis Date   Allergy    Dysplastic nevus 02/29/2024   Left spinal upper back. Severe atypia, with focal scar, margin close. Excised 05/04/24, margins free   GERD (gastroesophageal reflux disease)    Hyperlipidemia    MS (multiple sclerosis) (HCC) 03/09/2024    PAST SURGICAL HISTORY: Past Surgical History:  Procedure Laterality Date   TYMPANOSTOMY TUBE PLACEMENT Bilateral     FAMILY HISTORY: Family History  Problem Relation Age of Onset   Diabetes Mother    Allergies Brother    Diabetes Paternal Uncle    Heart attack Paternal Uncle    Diabetes Paternal  Uncle    Heart attack Paternal Uncle    Diabetes Paternal Uncle    Heart attack Paternal Uncle    Diabetes Maternal Grandmother    Heart attack Maternal Grandmother    Diabetes Maternal Grandfather    Heart attack Maternal Grandfather     SOCIAL HISTORY: Social History   Socioeconomic History   Marital status: Significant Other    Spouse name: heather   Number of children: 2   Years of education: 12   Highest education level: High school graduate  Occupational History   Not on file  Tobacco Use   Smoking status: Never    Passive exposure: Never   Smokeless tobacco: Never  Vaping Use   Vaping status: Never Used  Substance and Sexual Activity   Alcohol use: Not Currently    Comment: social   Drug use: Never   Sexual activity: Yes    Birth control/protection: Condom  Other Topics Concern   Not on file  Social History Narrative   Not on file   Social Drivers of Health   Financial Resource Strain: Not on file  Food Insecurity: Not on file  Transportation  Needs: Not on file  Physical Activity: Not on file  Stress: Not on file  Social Connections: Not on file  Intimate Partner Violence: Not on file       PHYSICAL EXAM  Vitals:   06/22/24 1003  BP: (!) 136/92  Pulse: 79    There is no height or weight on file to calculate BMI.   General: The patient is well-developed and well-nourished and in no acute distress  HEENT:  Head is [place]/AT.  Sclera are anicteric.    Neck:  The neck is nontender.   Skin/Extremities: Extremities are without rash or  edema.   Excision on left back (dysplastic nevus; pre-cancerous).  Gangion cyst right wrist  Neurologic Exam  Mental status: The patient is alert and oriented x 3 at the time of the examination. The patient has apparent normal recent and remote memory, with an apparently normal attention span and concentration ability.   Speech is normal.  Cranial nerves: Extraocular movements are full. Pupils are equal, round, and reactive to light and accomodation.   . There is good facial sensation to soft touch bilaterally.Facial strength is normal.  Trapezius and sternocleidomastoid strength is normal. No dysarthria is noted.  . No obvious hearing deficits are noted.  Motor:  Muscle bulk is normal.   Tone is normal. Strength is  5 / 5 in all 4 extremities.   Sensory: Sensory testing is intact to pinprick, soft touch and vibration sensation in the arms and legs now  Coordination: Cerebellar testing reveals good finger-nose-finger and heel-to-shin bilaterally.  Gait and station: Station is normal.   Gait is normal. Tandem gait is normal. Romberg is negative.   Reflexes: Deep tendon reflexes are symmetric and normal (1 in arms, 2 at knees and ankles).        DIAGNOSTIC DATA (LABS, IMAGING, TESTING) - I reviewed patient records, labs, notes, testing and imaging myself where available.  Lab Results  Component Value Date   WBC 5.3 03/09/2024   HGB 15.9 03/09/2024   HCT 46.4 03/09/2024   MCV 86  03/09/2024   PLT 282 03/09/2024      Component Value Date/Time   NA 139 04/08/2024 0927   K 4.4 04/08/2024 0927   CL 104 04/08/2024 0927   CO2 19 (L) 04/08/2024 0927   GLUCOSE 94 04/08/2024 0927  BUN 15 04/08/2024 0927   CREATININE 1.11 04/08/2024 0927   CALCIUM  9.6 04/08/2024 0927   PROT 6.6 04/08/2024 0927   ALBUMIN 4.4 04/08/2024 0927   AST 22 04/08/2024 0927   ALT 35 04/08/2024 0927   ALKPHOS 118 04/08/2024 0927   BILITOT 0.7 04/08/2024 0927   Lab Results  Component Value Date   CHOL 145 04/08/2024   HDL 36 (L) 04/08/2024   LDLCALC 80 04/08/2024   TRIG 167 (H) 04/08/2024   CHOLHDL 4.0 04/08/2024   Lab Results  Component Value Date   HGBA1C 5.4 11/23/2023   No results found for: VITAMINB12 Lab Results  Component Value Date   TSH 2.400 11/23/2023       ASSESSMENT AND PLAN  Multiple sclerosis (HCC)  High risk medication use  Numbness  Ganglion cyst of dorsum of right wrist  Depression with anxiety  Continue Briumvi.  Will check labs and MRI at or around time of next visit Stay active and exercise Continue vit D - will recheck next visit Neurologically cleared for wrist surgery. Sertraline 50 mg for anxiety/mood Rtc 6 months or sooner if new or worsening symptoms.       Kajah Santizo A. Vear, MD, [facility] 06/22/2024, 10:48 AM Certified in Neurology, Clinical Neurophysiology, Sleep Medicine and Neuroimaging  [facility] [address] [place], [place] [address] [phone]

## 2024-08-15 ENCOUNTER — Ambulatory Visit (INDEPENDENT_AMBULATORY_CARE_PROVIDER_SITE_OTHER): Admitting: Family Medicine

## 2024-08-15 ENCOUNTER — Encounter: Payer: Self-pay | Admitting: Family Medicine

## 2024-08-15 VITALS — BP 120/84 | HR 90 | Ht 67.0 in | Wt 233.0 lb

## 2024-08-15 DIAGNOSIS — Z6836 Body mass index (BMI) 36.0-36.9, adult: Secondary | ICD-10-CM

## 2024-08-15 DIAGNOSIS — G35 Multiple sclerosis: Secondary | ICD-10-CM | POA: Diagnosis not present

## 2024-08-15 DIAGNOSIS — E66812 Obesity, class 2: Secondary | ICD-10-CM | POA: Diagnosis not present

## 2024-08-15 DIAGNOSIS — J00 Acute nasopharyngitis [common cold]: Secondary | ICD-10-CM | POA: Diagnosis not present

## 2024-08-15 DIAGNOSIS — F5104 Psychophysiologic insomnia: Secondary | ICD-10-CM | POA: Diagnosis not present

## 2024-08-15 LAB — POCT RAPID STREP A (OFFICE): Rapid Strep A Screen: NEGATIVE

## 2024-08-15 MED ORDER — TRAZODONE HCL 50 MG PO TABS: 25.0000 mg | ORAL_TABLET | Freq: Every evening | ORAL | 0 refills | Status: DC | PRN

## 2024-08-15 NOTE — Assessment & Plan Note (Signed)
 Pharyngitis and throat discomfort - Throat discomfort for the past 8-9 days, initially presenting as drainage and soreness localized to the left side of the throat - Observed a spot at the back of the throat, similar to previous strep infections - Discomfort subsided temporarily but subsequently recurred - Recent negative strep test - Positive contact with son who had strep infection two weeks ago  Sinonasal and ocular symptoms - Congestion, dry eyes, and blurry vision attributed to sinus issues - Daily use of Flonase for nasal congestion - Astelin  available but avoided due to unpleasantness - Pressure sensation behind the eyes and occasional eye twitching, associated with sinus pressure  Physical Exam HEENT: Oropharynx erythematous without exudates; nasal mucosa erythematous and edematous; bilateral maxillary sinus tenderness to palpation and percussion; tympanic membranes and canals benign bilaterally NECK: No lymphadenopathy palpated  Acute sinusitis with pharyngeal involvement  Suspected bacterial infection due to symptom duration. - Instructed to take Z-Pak (azithromycin ) for full course. - Recommended continued use of Flonase for nasal congestion. - Suggested switching to Rhinocort or Nasacort if Flonase is ineffective. - Advised use of Mucinex to thin mucus secretions. - Consider using Astelin  nasal spray if symptoms persist.

## 2024-08-15 NOTE — Assessment & Plan Note (Signed)
 Weight management and lifestyle modification - Active efforts to lose weight to improve multiple sclerosis symptoms - No weight loss over the past 1.5 months despite dietary modifications and high physical activity - Diet consists mainly of chicken, [place], fish, and greens; avoidance of soda and energy drinks - Averages 12,000 to 15,000 steps per day at work  Weight Management Difficulty losing weight despite dietary changes and high physical activity. Discussed set point, stress, sleep, and related impacts on weight. - Recommend using MyFitnessPal app to track caloric intake and physical activity. - Encourage continued dietary modifications focusing on low-calorie dense foods. - Suggest increasing physical activity intensity or variety to overcome baseline adaptation.

## 2024-08-15 NOTE — Patient Instructions (Addendum)
 Patient Plan for Post-Visit Guidance  Weight Management - Use the MyFitnessPal app to track daily calories and physical activity. - Continue dietary changes, focusing on low-calorie dense foods. - Consider increasing the intensity or variety of physical activity to help with weight loss.  Multiple Sclerosis - Continue regular follow-up with your MS specialist. - Monitor stress and discuss the option of starting an SNRI if stress becomes difficult to manage.  Acute Sinusitis and Throat Discomfort - Take the full course of Z-Pak (azithromycin ) as prescribed. - Continue using Flonase for nasal congestion. - If Flonase is not effective, try Rhinocort or Nasacort. - Use Mucinex to help thin mucus. - Consider using Astelin  nasal spray if symptoms do not improve.  Insomnia and Stress - Take trazodone  25-50 mg at night as needed for sleep. Start with half a tablet if preferred. - Monitor your sleep quality and adjust the dose or timing as needed.  Red Flags - If you experience severe throat pain, difficulty breathing, chest pain, high fever, new or worsening neurological symptoms, severe allergic reactions, or any new or concerning symptoms, seek medical attention promptly.  Key Ways to Help Your Body Reach and Maintain a Healthier Set Point  Gradual Calorie Reduction: Eating slightly fewer calories over time can help your body adjust to a lower weight without triggering hunger or metabolic slow-downs. Focus on balanced meals with more protein and fewer refined carbs.  Reference: Lucio SLACK, Stevphen KANDICE Motto [facility], Wilding JP. Management of obesity. The Lancet. 2016.  Regular Exercise: A mix of aerobic and strength training helps maintain weight loss by boosting metabolism and burning fat. High-intensity interval training (HIIT) may be especially effective.  Reference: D[employer] AG, Montani JP. Body composition and thermogenesis in pathways to obesity. Obesity Reviews. 2012.  Mindful Eating:  Paying attention to your hunger and fullness cues can help prevent overeating and lead to more sustainable weight loss.  Reference: Claudell RONDEL Douglass LULLA Lequita TA. Behavioral treatment of obesity. [facility]. 2011.  Sleep and Stress Management: Improving sleep and managing stress can reduce cravings and overeating by balancing hormones that control hunger and fullness.  Reference: Tobie HOOPS, Hu FB. Short sleep duration and weight gain. Obesity ([place]). 2008.  Medications: Drugs like GLP-1 receptor agonists can help reduce hunger and aid in long-term weight loss when combined with lifestyle changes.  Reference: Pi-Sunyer X. The medical management of obesity. NEJM. 2019.  Bariatric Surgery: For some, weight-loss surgery can effectively reset the body's weight set point by altering how the body processes food, leading to lasting weight loss.  Reference: Stefater MA, Wilson-Prez HE, et al. Insights from mechanistic comparisons of bariatric surgeries. Endocrine Reviews. 2012   Sleep hygiene advice  When possible, maximize regularity in activity and sleep schedule   Regularity in the timing of sleep, food intake, and social activity helps to stabilize the biological clock.Minimizing discrepancies in sleep timing between on-shift and off-shift periods may help you adapt to a fixed-shift schedule and may also help you adapt to each shift type in a rotating-shift schedule (depending onrotation speed and direction).   Create a sleep-friendly bedroom environment   Make sure that your bed is comfortable and that your bedroom is dark, quiet, and cool (around 68F or 18C).Blackout shades may be particularly important to block sunlight during daytime sleep. Creating constantbackground noise in the sleep environment with a fan or humidifier, for example, will eliminate unexpectedsounds that would otherwise wake you up.   Limit exposure to bright  light before daytime sleep    Exposure to bright light (eg, sunlight during the morning commute home following a night shift) can bealerting and may also set your biological clock to a time that interferes with daytime sleep.   Make the last hour before bed a wind-down time   Engage in relaxing and pleasant activities, dim or block light in the room, and have a light snack.   Do not use alcohol to help you sleep and do not consume alcohol too close to bedtime   Although alcohol may help you to fall asleep more easily, it disrupts your sleep during the night by causingfrequent awakenings. One drink of alcohol should not be consumed within three hours of bedtime.   Smoking and other drugs will disrupt your sleep   If you smoke, do not smoke too close to bedtime or if you wake up during the intended sleep period. Mostdrugs of abuse can disrupt sleep.   Avoid caffeinated products within six hours of bedtime   In addition to coffee, these may include tea, chocolate, and many sodas.   Exercise regularly, but avoid activities that raise body temperature close to bedtime   Regular exercise can improve sleep quality, but exercising or having a warm bath too close to bedtime candisrupt your ability to fall asleep. Warm baths should be avoided within 1.5 hours of bedtime.   Avoid consuming more than 8 to 10 ounces of liquids close to bedtime   A full or semi-full bladder can contribute to awakenings. Restrict liquids close to bedtime and empty yourbladder just before going to bed.

## 2024-08-15 NOTE — Progress Notes (Signed)
 Primary Care / Sports Medicine Office Visit  Patient Information:  Patient ID: Sean Freeman, male DOB: 07/19/1986 Age: 38 y.o. MRN: [id number]   Sean Freeman is a pleasant 38 y.o. male presenting with the following:  Chief Complaint  Patient presents with   Sore Throat    Sore throat x 8-9 days. Patients son had strep about 2.5 weeks ago.    Vitals:   08/15/24 1500  BP: 120/84  Pulse: 90  SpO2: 97%   Vitals:   08/15/24 1500  Weight: 233 lb (105.7 kg)  Height: 5' 7 (1.702 m)   Body mass index is 36.49 kg/m.  No results found.   Independent interpretation of notes and tests performed by another provider:   None  Procedures performed:   None  Pertinent History, Exam, Impression, and Recommendations:   Problem List Items Addressed This Visit     Class 2 severe obesity due to excess calories with serious comorbidity and body mass index (BMI) of 36.0 to 36.9 in adult (HCC)   Weight management and lifestyle modification - Active efforts to lose weight to improve multiple sclerosis symptoms - No weight loss over the past 1.5 months despite dietary modifications and high physical activity - Diet consists mainly of chicken, [place], fish, and greens; avoidance of soda and energy drinks - Averages 12,000 to 15,000 steps per day at work  Weight Management Difficulty losing weight despite dietary changes and high physical activity. Discussed set point, stress, sleep, and related impacts on weight. - Recommend using MyFitnessPal app to track caloric intake and physical activity. - Encourage continued dietary modifications focusing on low-calorie dense foods. - Suggest increasing physical activity intensity or variety to overcome baseline adaptation.      Multiple sclerosis (HCC)   Multiple sclerosis symptoms and disease management - Diagnosis of multiple sclerosis - Tingling in the hands, particularly during periods of inactivity - Difficulty sleeping unless  extremely fatigued - Sleep disturbances contribute to increased stress and irritability  Multiple sclerosis Diagnosis causing stress and anxiety. Current treatment includes infusions. Discussed stress management and medication options. - Encourage continued follow-up with MS specialist. - Discussed potential use of SNRI if stress management becomes necessary.      Nasopharyngitis - Primary   Pharyngitis and throat discomfort - Throat discomfort for the past 8-9 days, initially presenting as drainage and soreness localized to the left side of the throat - Observed a spot at the back of the throat, similar to previous strep infections - Discomfort subsided temporarily but subsequently recurred - Recent negative strep test - Positive contact with son who had strep infection two weeks ago  Sinonasal and ocular symptoms - Congestion, dry eyes, and blurry vision attributed to sinus issues - Daily use of Flonase for nasal congestion - Astelin  available but avoided due to unpleasantness - Pressure sensation behind the eyes and occasional eye twitching, associated with sinus pressure  Physical Exam HEENT: Oropharynx erythematous without exudates; nasal mucosa erythematous and edematous; bilateral maxillary sinus tenderness to palpation and percussion; tympanic membranes and canals benign bilaterally NECK: No lymphadenopathy palpated  Acute sinusitis with pharyngeal involvement  Suspected bacterial infection due to symptom duration. - Instructed to take Z-Pak (azithromycin ) for full course. - Recommended continued use of Flonase for nasal congestion. - Suggested switching to Rhinocort or Nasacort if Flonase is ineffective. - Advised use of Mucinex to thin mucus secretions. - Consider using Astelin  nasal spray if symptoms persist.      Relevant Orders  POCT rapid strep A (Completed)   Psychophysiologic insomnia   Psychological stress and sleep disturbance - Mental challenges in coping  with multiple sclerosis diagnosis - Increased stress and irritability, believed to be exacerbated by poor sleep - Prescribed sertraline  but has not initiated therapy due to concerns about potential side effects experienced by spouse with a similar medication  Insomnia Exacerbated by stress and anxiety related to multiple sclerosis diagnosis. Discussed sleep's impact on neurotransmitters and stress. - Prescribe trazodone  50 mg, instruct to take half to one tablet (25-50 mg) nightly as needed for sleep. - Advise to monitor sleep quality and adjust trazodone  dose or timing as needed.      Relevant Medications   traZODone  (DESYREL ) 50 MG tablet   A total of 46 minutes was spent on the date of service, 08/15/2024, encompassing both face-to-face and non-face-to-face time. This included review of prior records and imaging (e.g., MRI and/or radiographs), medical chart review, information gathering, documentation, care coordination with clinic staff, discussion and counseling with the patient regarding clinical findings and treatment options, and planning for follow-up and next steps in management.   Orders & Medications Medications:  Meds ordered this encounter  Medications   traZODone  (DESYREL ) 50 MG tablet    Sig: Take 0.5-1 tablets (25-50 mg total) by mouth at bedtime as needed for sleep.    Dispense:  30 tablet    Refill:  0   Orders Placed This Encounter  Procedures   POCT rapid strep A     No follow-ups on file.     Selinda JINNY Ku, MD, [facility]   Primary Care Sports Medicine Primary Care and Sports Medicine at [facility]

## 2024-08-15 NOTE — Assessment & Plan Note (Signed)
 Multiple sclerosis symptoms and disease management - Diagnosis of multiple sclerosis - Tingling in the hands, particularly during periods of inactivity - Difficulty sleeping unless extremely fatigued - Sleep disturbances contribute to increased stress and irritability  Multiple sclerosis Diagnosis causing stress and anxiety. Current treatment includes infusions. Discussed stress management and medication options. - Encourage continued follow-up with MS specialist. - Discussed potential use of SNRI if stress management becomes necessary.

## 2024-08-15 NOTE — Assessment & Plan Note (Signed)
 Psychological stress and sleep disturbance - Mental challenges in coping with multiple sclerosis diagnosis - Increased stress and irritability, believed to be exacerbated by poor sleep - Prescribed sertraline  but has not initiated therapy due to concerns about potential side effects experienced by spouse with a similar medication  Insomnia Exacerbated by stress and anxiety related to multiple sclerosis diagnosis. Discussed sleep's impact on neurotransmitters and stress. - Prescribe trazodone  50 mg, instruct to take half to one tablet (25-50 mg) nightly as needed for sleep. - Advise to monitor sleep quality and adjust trazodone  dose or timing as needed.

## 2024-09-17 ENCOUNTER — Other Ambulatory Visit: Payer: Self-pay | Admitting: Family Medicine

## 2024-09-17 DIAGNOSIS — F5104 Psychophysiologic insomnia: Secondary | ICD-10-CM

## 2024-09-20 NOTE — Telephone Encounter (Signed)
 Requested Prescriptions  Pending Prescriptions Disp Refills   traZODone  (DESYREL ) 50 MG tablet [Pharmacy Med Name: TRAZODONE  50 MG TABLET] 90 tablet 1    Sig: TAKE 0.5-1 TABLETS BY MOUTH AT BEDTIME AS NEEDED FOR SLEEP.     Psychiatry: Antidepressants - Serotonin Modulator Passed - 09/20/2024 11:57 AM      Passed - Valid encounter within last 6 months    Recent Outpatient Visits           1 month ago Nasopharyngitis   [place] [facility], Selinda PARAS, MD   5 months ago Rhinosinusitis   [facility] at [facility], Selinda PARAS, MD   7 months ago Mixed hyperlipidemia   [facility] Medicine at [facility], Selinda PARAS, MD       Future Appointments             In 1 month Claudene Lehmann, MD [facility]   In 1 month Ku, Selinda PARAS, MD [facility] Primary Care & Sports Medicine at [facility], [phone] [place]

## 2024-09-29 ENCOUNTER — Other Ambulatory Visit: Payer: Self-pay | Admitting: *Deleted

## 2024-09-29 DIAGNOSIS — M5412 Radiculopathy, cervical region: Secondary | ICD-10-CM

## 2024-09-29 MED ORDER — GABAPENTIN 300 MG PO CAPS: 300.0000 mg | ORAL_CAPSULE | Freq: Three times a day (TID) | ORAL | 5 refills | Status: AC

## 2024-09-29 NOTE — Telephone Encounter (Signed)
 Last seen on 06/22/24 Follow up scheduled on 01/23/25

## 2024-10-04 ENCOUNTER — Other Ambulatory Visit: Payer: Self-pay | Admitting: Family Medicine

## 2024-10-04 DIAGNOSIS — M5412 Radiculopathy, cervical region: Secondary | ICD-10-CM

## 2024-10-06 NOTE — Telephone Encounter (Signed)
 Requested Prescriptions  Pending Prescriptions Disp Refills   celecoxib  (CELEBREX ) 100 MG capsule [Pharmacy Med Name: CELECOXIB  100 MG CAPSULE] 180 capsule 0    Sig: TAKE 1 CAPSULE BY MOUTH 2 TIMES DAILY AS NEEDED.     Analgesics:  COX2 Inhibitors Failed - 10/06/2024 11:13 AM      Failed - Manual Review: Labs are only required if the patient has taken medication for more than 8 weeks.      Passed - HGB in normal range and within 360 days    Hemoglobin  Date Value Ref Range Status  03/09/2024 15.9 13.0 - 17.7 g/dL Final         Passed - Cr in normal range and within 360 days    Creatinine, Ser  Date Value Ref Range Status  04/08/2024 1.11 0.76 - 1.27 mg/dL Final         Passed - HCT in normal range and within 360 days    Hematocrit  Date Value Ref Range Status  03/09/2024 46.4 37.5 - 51.0 % Final         Passed - AST in normal range and within 360 days    AST  Date Value Ref Range Status  04/08/2024 22 0 - 40 IU/L Final         Passed - ALT in normal range and within 360 days    ALT  Date Value Ref Range Status  04/08/2024 35 0 - 44 IU/L Final         Passed - eGFR is 30 or above and within 360 days    eGFR  Date Value Ref Range Status  04/08/2024 87 >59 mL/min/1.73 Final         Passed - Patient is not pregnant      Passed - Valid encounter within last 12 months    Recent Outpatient Visits           1 month ago Nasopharyngitis   [place] [facility], Selinda PARAS, MD   5 months ago Rhinosinusitis   [facility] Medicine at [facility], Selinda PARAS, MD   7 months ago Mixed hyperlipidemia   [facility] at [facility], Selinda PARAS, MD       Future Appointments             In 1 month Claudene Lehmann, MD [facility]   In 1 month Ku, Selinda PARAS, MD [facility] Primary Care & Sports Medicine at [facility], [phone]  [place]

## 2024-10-24 ENCOUNTER — Ambulatory Visit: Admitting: Medical

## 2024-11-07 ENCOUNTER — Ambulatory Visit: Admitting: Dermatology

## 2024-11-07 ENCOUNTER — Encounter: Payer: Self-pay | Admitting: Dermatology

## 2024-11-07 DIAGNOSIS — D235 Other benign neoplasm of skin of trunk: Secondary | ICD-10-CM

## 2024-11-07 DIAGNOSIS — D229 Melanocytic nevi, unspecified: Secondary | ICD-10-CM

## 2024-11-07 DIAGNOSIS — Z86018 Personal history of other benign neoplasm: Secondary | ICD-10-CM

## 2024-11-07 DIAGNOSIS — D239 Other benign neoplasm of skin, unspecified: Secondary | ICD-10-CM

## 2024-11-07 DIAGNOSIS — L578 Other skin changes due to chronic exposure to nonionizing radiation: Secondary | ICD-10-CM | POA: Diagnosis not present

## 2024-11-07 DIAGNOSIS — D1801 Hemangioma of skin and subcutaneous tissue: Secondary | ICD-10-CM | POA: Diagnosis not present

## 2024-11-07 DIAGNOSIS — L814 Other melanin hyperpigmentation: Secondary | ICD-10-CM

## 2024-11-07 DIAGNOSIS — Z1283 Encounter for screening for malignant neoplasm of skin: Secondary | ICD-10-CM | POA: Diagnosis not present

## 2024-11-07 DIAGNOSIS — L7211 Pilar cyst: Secondary | ICD-10-CM

## 2024-11-07 DIAGNOSIS — W908XXA Exposure to other nonionizing radiation, initial encounter: Secondary | ICD-10-CM

## 2024-11-07 DIAGNOSIS — D2372 Other benign neoplasm of skin of left lower limb, including hip: Secondary | ICD-10-CM

## 2024-11-07 DIAGNOSIS — D2362 Other benign neoplasm of skin of left upper limb, including shoulder: Secondary | ICD-10-CM

## 2024-11-07 NOTE — Patient Instructions (Signed)

## 2024-11-07 NOTE — Progress Notes (Signed)
 Follow-Up Visit   Subjective  Sean Freeman is a 38 y.o. male who presents for the following: Skin Cancer Screening and Full Body Skin Exam  The patient presents for Total-Body Skin Exam (TBSE) for skin cancer screening and mole check. The patient has spots, moles and lesions to be evaluated, some may be new or changing and the patient may have concern these could be cancer.  Hx DN.   The following portions of the chart were reviewed this encounter and updated as appropriate: medications, allergies, medical history  Review of Systems:  No other skin or systemic complaints except as noted in HPI or Assessment and Plan.  Objective  Well appearing patient in no apparent distress; mood and affect are within normal limits.  A full examination was performed including scalp, head, eyes, ears, nose, lips, neck, chest, axillae, abdomen, back, buttocks, bilateral upper extremities, bilateral lower extremities, hands, feet, fingers, toes, fingernails, and toenails. All findings within normal limits unless otherwise noted below.   Relevant physical exam findings are noted in the Assessment and Plan.    Assessment & Plan   SKIN CANCER SCREENING PERFORMED TODAY.  ACTINIC DAMAGE - Chronic condition, secondary to cumulative UV/sun exposure - diffuse scaly erythematous macules with underlying dyspigmentation - Recommend daily broad spectrum sunscreen SPF 30+ to sun-exposed areas, reapply every 2 hours as needed.  - Staying in the shade or wearing long sleeves, sun glasses (UVA+UVB protection) and wide brim hats (4-inch brim around the entire circumference of the hat) are also recommended for sun protection.  - Call for new or changing lesions.  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  - Call if any new or changing lesions are noted between office visits  Pilar Cyst Exam: Subcutaneous nodules at right occipital, left posterior parietal scalp.  Benign-appearing. 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 Discussed that a cyst is a benign growth that can grow over time and sometimes get irritated or inflamed. Recommend observation if it is not bothersome. Discussed option of surgical excision to remove it if it is growing, symptomatic, or other changes noted. Please call for new or changing lesions so they can be evaluated.  DERMATOFIBROMA Exam: Firm pink/Purkey papulenodule with dimple sign. L back L arm L lower leg  Treatment Plan: A dermatofibroma is a benign growth possibly related to trauma, such as an insect bite, cut from shaving, or inflamed acne-type bump.  Treatment options to remove include shave or excision with resulting scar and risk of recurrence.  Since benign-appearing and not bothersome, will observe for now.    MULTIPLE BENIGN NEVI   CHERRY ANGIOMA   ACTINIC ELASTOSIS   LENTIGINES   DERMATOFIBROMA   PILAR CYST   Return in about 6 months (around 05/07/2025) for TBSE, with Dr. Claudene, HxDN.  LILLETTE Lonell Drones, RMA, am acting as scribe for Boneta Claudene, MD .   Documentation: I have reviewed the  above documentation for accuracy and completeness, and I agree with the above.  Boneta Sharps, MD

## 2024-11-14 ENCOUNTER — Encounter: Payer: Self-pay | Admitting: Family Medicine

## 2024-11-14 ENCOUNTER — Ambulatory Visit (INDEPENDENT_AMBULATORY_CARE_PROVIDER_SITE_OTHER): Payer: Self-pay | Admitting: Family Medicine

## 2024-11-14 VITALS — BP 130/96 | HR 73 | Temp 98.2°F | Ht 67.0 in | Wt 233.0 lb

## 2024-11-14 DIAGNOSIS — M222X1 Patellofemoral disorders, right knee: Secondary | ICD-10-CM

## 2024-11-14 DIAGNOSIS — E782 Mixed hyperlipidemia: Secondary | ICD-10-CM | POA: Diagnosis not present

## 2024-11-14 DIAGNOSIS — G35D Multiple sclerosis, unspecified: Secondary | ICD-10-CM

## 2024-11-14 DIAGNOSIS — G5601 Carpal tunnel syndrome, right upper limb: Secondary | ICD-10-CM

## 2024-11-14 DIAGNOSIS — E66812 Obesity, class 2: Secondary | ICD-10-CM

## 2024-11-14 DIAGNOSIS — M4727 Other spondylosis with radiculopathy, lumbosacral region: Secondary | ICD-10-CM

## 2024-11-14 DIAGNOSIS — E559 Vitamin D deficiency, unspecified: Secondary | ICD-10-CM

## 2024-11-14 DIAGNOSIS — K219 Gastro-esophageal reflux disease without esophagitis: Secondary | ICD-10-CM

## 2024-11-14 DIAGNOSIS — F5104 Psychophysiologic insomnia: Secondary | ICD-10-CM

## 2024-11-14 DIAGNOSIS — M4722 Other spondylosis with radiculopathy, cervical region: Secondary | ICD-10-CM

## 2024-11-14 DIAGNOSIS — M222X2 Patellofemoral disorders, left knee: Secondary | ICD-10-CM

## 2024-11-14 DIAGNOSIS — Z0000 Encounter for general adult medical examination without abnormal findings: Secondary | ICD-10-CM

## 2024-11-14 DIAGNOSIS — Z6836 Body mass index (BMI) 36.0-36.9, adult: Secondary | ICD-10-CM

## 2024-11-16 ENCOUNTER — Encounter: Payer: Self-pay | Admitting: Family Medicine

## 2024-11-16 DIAGNOSIS — G5601 Carpal tunnel syndrome, right upper limb: Secondary | ICD-10-CM | POA: Insufficient documentation

## 2024-11-16 NOTE — Patient Instructions (Signed)
-   Obtain fasting labs with orders provided (can have water or black coffee but otherwise no food or drink x 8 hours before labs) - Review information provided - Attend eye doctor annually, dentist every 6 months, work towards or maintain 30 minutes of moderate intensity physical activity at least 5 days per week, and consume a balanced diet - Return in 1 year for physical - Contact us  for any questions between now and then   VISIT SUMMARY:  During your visit, we discussed your ongoing symptoms related to multiple sclerosis, including pain management, paresthesia, and fatigue. We also addressed your hypertension, carpal tunnel syndrome, and musculoskeletal pain.  YOUR PLAN:  RIGHT CARPAL TUNNEL SYNDROME: You have significant symptoms from carpal tunnel syndrome, and previous treatments provided only temporary relief. -Consider surgical intervention for long-term relief. -Discuss with the spine group about hydrodissection under ultrasound guidance. -Monitor your symptoms and consider a repeat injection if surgery is not pursued.  CERVICAL RADICULOPATHY: You have chronic neck pain that worsens with certain positions. -An MRI of your cervical spine has been ordered. -You are referred to the interventional spine group for a consultation and potential steroid injection. -Consider physical therapy to improve spinal alignment.  LOW BACK PAIN WITH POSSIBLE LUMBAR RADICULOPATHY: You have chronic lower back pain that worsens with walking on concrete. -An MRI of your lumbar spine has been ordered. -You are referred to the interventional spine group for a consultation and potential steroid injection. -Consider physical therapy to improve spinal alignment.  MULTIPLE SCLEROSIS: Ongoing management with regular infusions. Recent infusion caused fatigue. -Continue your current MS management plan with regular infusions. -Monitor for new symptoms and manage stress to prevent exacerbations.  ESSENTIAL  HYPERTENSION: Your blood pressure has been intermittently elevated, likely due to stress. -Monitor your blood pressure at home using a brachial cuff. -Follow up in two months for a blood pressure evaluation. -Consider lifestyle modifications to manage stress and blood pressure.

## 2024-11-16 NOTE — Progress Notes (Signed)
 Annual Physical Exam Visit  Patient Information:  Patient ID: Sean Freeman, male DOB: Apr 16, 1986 Age: 38 y.o. MRN: [id number]   Subjective:   CC: Annual Physical Exam  HPI:  Sean Freeman is here for their annual physical.  I reviewed the past medical history, family history, social history, surgical history, and allergies today and changes were made as necessary.  Please see the problem list section below for additional details.  Past Medical History: Past Medical History:  Diagnosis Date   Allergy    Dysplastic nevus 02/29/2024   Left spinal upper back. Severe atypia, with focal scar, margin close. Excised 05/04/24, margins free   GERD (gastroesophageal reflux disease)    Hyperlipidemia    MS (multiple sclerosis) 03/09/2024   Past Surgical History: Past Surgical History:  Procedure Laterality Date   TYMPANOSTOMY TUBE PLACEMENT Bilateral    Family History: Family History  Problem Relation Age of Onset   Diabetes Mother    Allergies Brother    Diabetes Paternal Uncle    Heart attack Paternal Uncle    Diabetes Paternal Uncle    Heart attack Paternal Uncle    Diabetes Paternal Uncle    Heart attack Paternal Uncle    Diabetes Maternal Grandmother    Heart attack Maternal Grandmother    Diabetes Maternal Grandfather    Heart attack Maternal Grandfather    Allergies: Allergies  Allergen Reactions   Amoxicillin Hives   Penicillins Hives   Rosuvastatin  Rash and Dermatitis   Health Maintenance: Health Maintenance  Topic Date Due   Hepatitis B Vaccines 19-59 Average Risk (1 of 3 - 19+ 3-dose series) Never done   HPV VACCINES (1 - Risk 3-dose SCDM series) Never done   Influenza Vaccine  03/21/2025 (Originally 07/22/2024)   DTaP/Tdap/Td (2 - Td or Tdap) 02/14/2032   Hepatitis C Screening  Completed   HIV Screening  Completed   Pneumococcal Vaccine  Aged Out   Meningococcal B Vaccine  Aged Out   COVID-19 Vaccine  Discontinued    HM Colonoscopy   This  patient has no relevant Health Maintenance data.    Medications: Current Outpatient Medications on File Prior to Visit  Medication Sig Dispense Refill   atorvastatin  (LIPITOR) 10 MG tablet Take 1 tablet (10 mg total) by mouth daily. 90 tablet 3   baclofen  (LIORESAL ) 20 MG tablet TAKE 1 TABLET (20 MG TOTAL) BY MOUTH 3 (THREE) TIMES DAILY AS NEEDED FOR MUSCLE SPASMS. 270 tablet 1   celecoxib  (CELEBREX ) 100 MG capsule TAKE 1 CAPSULE BY MOUTH 2 TIMES DAILY AS NEEDED. 180 capsule 0   cholecalciferol (VITAMIN D3) 25 MCG (1000 UNIT) tablet Take 1,000 Units by mouth daily.     gabapentin  (NEURONTIN ) 300 MG capsule Take 1 capsule (300 mg total) by mouth 3 (three) times daily. 90 capsule 5   traZODone  (DESYREL ) 50 MG tablet TAKE 0.5-1 TABLETS BY MOUTH AT BEDTIME AS NEEDED FOR SLEEP. 90 tablet 1   No current facility-administered medications on file prior to visit.    Discussed the use of AI scribe software for clinical note transcription with the patient, who gave verbal consent to proceed.   Objective:   Vitals:   11/14/24 0850 11/14/24 0858  BP: (!) 144/96 (!) 130/96  Pulse: 73   Temp: 98.2 F (36.8 C)   SpO2: 99%    Vitals:   11/14/24 0850  Weight: 233 lb (105.7 kg)  Height: 5' 7 (1.702 m)   Body mass index is  36.49 kg/m.  General: Well Developed, well nourished, and in no acute distress.  Neuro: Alert and oriented x3, extra-ocular muscles intact, sensation grossly intact. Cranial nerves II through XII are grossly intact, motor, sensory, and coordinative functions are intact. HEENT: Normocephalic, atraumatic, neck supple, no masses, no lymphadenopathy, thyroid  nonenlarged. Oropharynx, nasopharynx, external ear canals are unremarkable. Skin: Warm and dry, no rashes noted.  Cardiac: Regular rate and rhythm, no murmurs rubs or gallops. No peripheral edema. Pulses symmetric. Respiratory: Clear to auscultation bilaterally. Speaking in full sentences.  Abdominal: Soft, nontender,  nondistended, positive bowel sounds, no masses, no organomegaly. Musculoskeletal: Stable, and with full range of motion.   Impression and Recommendations:   The patient was counselled, risk factors were discussed, and anticipatory guidance given.  History of Present Illness Sean Freeman is a 38 year old male with multiple sclerosis who presents with ongoing symptoms and pain management issues.  Paresthesia and neuropathic symptoms - Persistent tingling in hands and legs, most noticeable upon waking and after extensive walking - No new developments in paresthesia symptoms - Recent nerve conduction study consistent with carpal tunnel syndrome - Significant discomfort in fingers, sometimes preventing full flexion  Musculoskeletal pain - Lower back pain initially managed with chiropractic care, with only temporary relief - Recurrence of lower back pain now affecting knees, especially after walking on concrete at work  Pain management and medication use - Currently taking Celebrex  twice daily and baclofen  twice daily for pain management related to multiple sclerosis and other symptoms - Missing a dose of Celebrex  results in noticeable discomfort - Previous cortisone injection for carpal tunnel syndrome provided relief for only three days  Fatigue and infusion-related symptoms - Infusion in October resulted in fatigue lasting four to five days without symptom improvement - Increased stress around the time of multiple sclerosis infusions due to anticipated side effects  Sleep disturbance - Sleep has improved overall - Occasional need for medication to aid sleep  Hypertension and stress - Elevated blood pressure during recent medical visits, attributed to stress and anxiety related to medical procedures - Home blood pressure readings in the 140s  Gastrointestinal symptoms - No issues with reflux - Reflux managed with over-the-counter medication taken in the morning  Physical  Exam GENERAL: Well-appearing, in no acute distress. NECK: Neck supple, no nuchal rigidity, non-tender. No lymphadenopathy or thyromegaly. CHEST: Lungs clear to auscultation bilaterally. CARDIOVASCULAR: Regular rate and rhythm, normal heart sounds.  Results RADIOLOGY Neck MRI: Disc herniation impinging on the spinal cord, not indicated for surgery  DIAGNOSTIC Nerve conduction study: Carpal tunnel syndrome confirmed  Assessment and Plan General adult medical examination Routine examination with slightly elevated blood pressure, likely stress-related. - Scheduled follow-up in two months for blood pressure check. - Ordered labs for routine physical examination.  Right carpal tunnel syndrome Chronic condition with significant symptoms. Previous cortisone injection provided temporary relief. Discussed surgical intervention versus repeat injection, including risks and benefits. - Consider surgical intervention. - Discuss with spine group about hydrodissection under ultrasound guidance. - Monitor symptoms and consider repeat injection if surgery is not pursued.  Cervical radiculopathy Chronic condition with symptoms exacerbated by certain positions. Previous MRI showed cervical spine findings. Discussed potential for steroid injections for pain management. - Ordered MRI of the cervical spine. - Referred to interventional spine group for consultation and potential steroid injection. - Consider physical therapy for spinal alignment improvement.  Low back pain with possible lumbar radiculopathy Chronic pain exacerbated by walking on concrete. Previous x-rays and injections provided  temporary relief. Discussed potential for steroid injections for pain management. - Ordered MRI of the lumbar spine. - Referred to interventional spine group for consultation and potential steroid injection. - Consider physical therapy for spinal alignment improvement.  Multiple sclerosis Ongoing management with  regular infusions. Recent infusion caused fatigue. Discussed stress as an exacerbating factor and importance of stress management. - Continue current MS management plan with regular infusions. - Monitor for new symptoms and manage stress to prevent exacerbations.  Essential hypertension Intermittent elevated blood pressure, likely stress-related. Discussed importance of home monitoring and potential need for treatment. - Monitor blood pressure at home using a brachial cuff. - Scheduled follow-up in two months for blood pressure evaluation. - Consider lifestyle modifications to manage stress and blood pressure.  Problem List Items Addressed This Visit     Healthcare maintenance - Primary   Relevant Orders   CBC   Comprehensive metabolic panel with GFR   Hemoglobin A1c   Lipid panel   Apo A1 + B + Ratio   Mixed hyperlipidemia   Relevant Orders   Comprehensive metabolic panel with GFR   Lipid panel   Apo A1 + B + Ratio   Right carpal tunnel syndrome   Vitamin D  deficiency   Relevant Orders   VITAMIN D  25 Hydroxy (Vit-D Deficiency, Fractures)     Orders & Medications Medications: No orders of the defined types were placed in this encounter.  Orders Placed This Encounter  Procedures   CBC   Comprehensive metabolic panel with GFR   Hemoglobin A1c   Lipid panel   Apo A1 + B + Ratio   VITAMIN D  25 Hydroxy (Vit-D Deficiency, Fractures)     Return in about 2 months (around 01/14/2025), or BP check.    Selinda JINNY Ku, MD, [facility]   Primary Care Sports Medicine Primary Care and Sports Medicine at [facility]

## 2024-11-22 ENCOUNTER — Ambulatory Visit: Payer: Self-pay | Admitting: Family Medicine

## 2024-11-22 ENCOUNTER — Telehealth (HOSPITAL_BASED_OUTPATIENT_CLINIC_OR_DEPARTMENT_OTHER): Payer: Self-pay | Admitting: *Deleted

## 2024-11-22 DIAGNOSIS — E782 Mixed hyperlipidemia: Secondary | ICD-10-CM

## 2024-11-22 LAB — COMPREHENSIVE METABOLIC PANEL WITH GFR
ALT: 46 IU/L — ABNORMAL HIGH (ref 0–44)
AST: 29 IU/L (ref 0–40)
Albumin: 4.6 g/dL (ref 4.1–5.1)
Alkaline Phosphatase: 123 IU/L (ref 47–123)
BUN/Creatinine Ratio: 12 (ref 9–20)
BUN: 14 mg/dL (ref 6–20)
Bilirubin Total: 0.5 mg/dL (ref 0.0–1.2)
CO2: 23 mmol/L (ref 20–29)
Calcium: 10.2 mg/dL (ref 8.7–10.2)
Chloride: 104 mmol/L (ref 96–106)
Creatinine, Ser: 1.2 mg/dL (ref 0.76–1.27)
Globulin, Total: 2.4 g/dL (ref 1.5–4.5)
Glucose: 103 mg/dL — ABNORMAL HIGH (ref 70–99)
Potassium: 4.6 mmol/L (ref 3.5–5.2)
Sodium: 141 mmol/L (ref 134–144)
Total Protein: 7 g/dL (ref 6.0–8.5)
eGFR: 79 mL/min/1.73 (ref >59)

## 2024-11-22 LAB — CBC
Hematocrit: 47.4 % (ref 37.5–51.0)
Hemoglobin: 15.8 g/dL (ref 13.0–17.7)
MCH: 28.5 pg (ref 26.6–33.0)
MCHC: 33.3 g/dL (ref 31.5–35.7)
MCV: 85 fL (ref 79–97)
Platelets: 247 x10E3/uL (ref 150–450)
RBC: 5.55 x10E6/uL (ref 4.14–5.80)
RDW: 13 % (ref 11.6–15.4)
WBC: 5.4 x10E3/uL (ref 3.4–10.8)

## 2024-11-22 LAB — LIPID PANEL
Chol/HDL Ratio: 4.3 ratio (ref 0.0–5.0)
Cholesterol, Total: 159 mg/dL (ref 100–199)
HDL: 37 mg/dL — ABNORMAL LOW (ref >39)
LDL Chol Calc (NIH): 84 mg/dL (ref 0–99)
Triglycerides: 227 mg/dL — ABNORMAL HIGH (ref 0–149)
VLDL Cholesterol Cal: 38 mg/dL (ref 5–40)

## 2024-11-22 LAB — APO A1 + B + RATIO
Apolipo. B/A-1 Ratio: 0.7 ratio (ref 0.0–0.7)
Apolipoprotein A-1: 119 mg/dL (ref 101–178)
Apolipoprotein B: 85 mg/dL (ref <90)

## 2024-11-22 LAB — HEMOGLOBIN A1C
Est. average glucose Bld gHb Est-mCnc: 105 mg/dL
Hgb A1c MFr Bld: 5.3 % (ref 4.8–5.6)

## 2024-11-22 LAB — VITAMIN D 25 HYDROXY (VIT D DEFICIENCY, FRACTURES): Vit D, 25-Hydroxy: 33.5 ng/mL (ref 30.0–100.0)

## 2024-11-22 MED ORDER — ATORVASTATIN CALCIUM 20 MG PO TABS: 20.0000 mg | ORAL_TABLET | Freq: Every day | ORAL | 3 refills | Status: AC

## 2024-11-22 NOTE — Telephone Encounter (Signed)
 Pt has been scheduled 11/28/24 per pt requested Monday 11/28/24. Appt with Cadence Franchester, [facility] @ 10:55.

## 2024-11-22 NOTE — Telephone Encounter (Signed)
   Name: Sean Freeman  DOB: 03-Dec-1986  MRN: [id number]  Primary Cardiologist: None  Chart reviewed as part of pre-operative protocol coverage. Because of Sean Freeman's past medical history and time since last visit, he will require a follow-up in-office visit in order to better assess preoperative cardiovascular risk. Pt has not been seen since 2023 (last seen in [place] office).   Pre-op covering staff: - Please schedule appointment and call patient to inform them. If patient already had an upcoming appointment within acceptable timeframe, please add pre-op clearance to the appointment notes so provider is aware. - Please contact requesting surgeon's office via preferred method (i.e, phone, fax) to inform them of need for appointment prior to surgery.   Damien JAYSON Braver, NP  11/22/2024, 1:59 PM

## 2024-11-22 NOTE — Telephone Encounter (Signed)
   Pre-operative Risk Assessment    Patient Name: Sean Freeman  DOB: 1986-11-28 MRN: [id number]   Date of last office visit: 06/30/22 DR. AGBOR-ETANG Date of next office visit: NONE   Request for Surgical Clearance    Procedure:  LEFT CARPAL TUNNEL RELEASE   Date of Surgery:  Clearance 12/12/24                                Surgeon:  DR. HARDIN CZAR Surgeon's Group or Practice Name:  [facility] Phone number:  [phone] Fax number:  [phone]   Type of Clearance Requested:   - Medical    Type of Anesthesia:  Not Indicated   Additional requests/questions:    Bonney Niels Jest   11/22/2024, 1:26 PM

## 2024-11-27 NOTE — Progress Notes (Unsigned)
 Cardiology Office Note   Date:  11/28/2024  ID:  Sean Freeman, DOB [birth year], MRN [id number] PCP: Alvia Selinda PARAS, MD  [facility] Providers Cardiologist:  None    History of Present Illness Sean Freeman is a 38 y.o. male with a h/o HLD, GERD, MS, and family h/o CAD who presents for pre-operative evaluation.   The patient was seen in 2023 for family history of CAD. Cardiac CTA showed no CAD. Echo showed LVEF 60-65%, now WMA.   Today, the patient presents for pre-op evaluation for left carpal tunnel release. He is scheduled for the 22nd. He denies chest pain. SOB, lower leg edema, lightheadedness, dizziness, palpitations, orthopnea or pnd. He walks daily at work, 5-6 miles daily. Diet is OK, but he is trying to improve this. BP is a little high, but he has been going though stress at home with his own health and the health of family members. PCP is following blood pressure and cholesterol.   Studies Reviewed EKG Interpretation Date/Time:  Monday November 28 2024 11:05:04 EST Ventricular Rate:  74 PR Interval:  142 QRS Duration:  94 QT Interval:  362 QTC Calculation: 401 R Axis:   41  Text Interpretation: Normal sinus rhythm Normal ECG No previous ECGs available Confirmed by Franchester, Uzziel Russey ([id number]) on 11/28/2024 11:18:32 AM    Echo 2023 1. Left ventricular ejection fraction, by estimation, is 60 to 65%. The  left ventricle has normal function. The left ventricle has no regional  wall motion abnormalities. Left ventricular diastolic parameters were  normal.   2. Right ventricular systolic function is normal. The right ventricular  size is normal. Tricuspid regurgitation signal is inadequate for assessing  PA pressure.   3. The mitral valve is normal in structure. No evidence of mitral valve  regurgitation. No evidence of mitral stenosis.   4. The aortic valve is tricuspid. Aortic valve regurgitation is not  visualized. No aortic stenosis is present.   5. The  inferior vena cava is normal in size with greater than 50%  respiratory variability, suggesting right atrial pressure of 3 mmHg.   Cardiac CTA 06/2022 IMPRESSION: 1. Normal coronary calcium  score of 0. Patient is low risk for coronary events.   2. Normal coronary origin with right dominance.   3. No evidence of CAD.   4. CAD-RADS 0. Consider non-atherosclerotic causes of shortness of breath or chest pain.     Physical Exam VS:  BP (!) 140/82 (BP Location: Left Arm, Patient Position: Sitting, Cuff Size: Normal)   Pulse 74   Ht 5' 7 (1.702 m)   Wt 230 lb 6.4 oz (104.5 kg)   SpO2 98%   BMI 36.09 kg/m        Wt Readings from Last 3 Encounters:  11/28/24 230 lb 6.4 oz (104.5 kg)  11/14/24 233 lb (105.7 kg)  08/15/24 233 lb (105.7 kg)    GEN: Well nourished, well developed in no acute distress NECK: No JVD; No carotid bruits CARDIAC: RRR, no murmurs, rubs, gallops RESPIRATORY:  Clear to auscultation without rales, wheezing or rhonchi  ABDOMEN: Soft, non-tender, non-distended EXTREMITIES:  No edema; No deformity   ASSESSMENT AND PLAN  Pre-operative cardiac evaluation for left carpal tunnel release Family history of CAD HLD The patient is scheduled for carpal tunnel release later this month. From a cardiac perspective, the patient is doing well. He denies chest pain, shortness of breath, lower leg edema, palpitations, pre-syncope or syncope. He walks 5-6 miles daily at work  with no exertional symptoms. He is trying to improve his diet to lower blood pressure and cholesterol. His PCP recently increase Lipitor, and he will continue to follow this. EKG today shows NSR. He is not on any blood thinners. OK to proceed with surgery with out any further cardiac work-up.  METS>4 RCRI = 0.5% risk of MACE    Dispo: Follow-up PRN  Signed, Sharlyne Koeneman VEAR Fishman, PA-C

## 2024-11-28 ENCOUNTER — Encounter: Payer: Self-pay | Admitting: Medical

## 2024-11-28 ENCOUNTER — Ambulatory Visit: Attending: Medical | Admitting: Medical

## 2024-11-28 VITALS — BP 140/82 | HR 74 | Ht 67.0 in | Wt 230.4 lb

## 2024-11-28 DIAGNOSIS — Z8249 Family history of ischemic heart disease and other diseases of the circulatory system: Secondary | ICD-10-CM | POA: Diagnosis not present

## 2024-11-28 DIAGNOSIS — Z01818 Encounter for other preprocedural examination: Secondary | ICD-10-CM

## 2024-11-28 DIAGNOSIS — R030 Elevated blood-pressure reading, without diagnosis of hypertension: Secondary | ICD-10-CM | POA: Diagnosis not present

## 2024-11-28 DIAGNOSIS — E782 Mixed hyperlipidemia: Secondary | ICD-10-CM

## 2024-11-28 NOTE — Patient Instructions (Addendum)
 Medication Instructions:  Your physician recommends that you continue on your current medications as directed. Please refer to the Current Medication list given to you today.    *If you need a refill on your cardiac medications before your next appointment, please call your pharmacy*  Lab Work: No labs ordered today    Testing/Procedures: No test ordered today   Follow-Up: At [facility], you and your health needs are our priority.  As part of our continuing mission to provide you with exceptional heart care, our providers are all part of one team.  This team includes your primary Cardiologist (physician) and Advanced Practice Providers or APPs (Physician Assistants and Nurse Practitioners) who all work together to provide you with the care you need, when you need it.  Your next appointment:   As needed  Provider:   Cadence Franchester, PA-C

## 2024-12-02 NOTE — Telephone Encounter (Signed)
 Office not resent to Dr. Francisco office at [phone]

## 2024-12-02 NOTE — Telephone Encounter (Signed)
 Requesting office called in asking for clearance to be faxed

## 2024-12-08 ENCOUNTER — Encounter: Payer: Self-pay | Admitting: Neurology

## 2024-12-08 NOTE — Telephone Encounter (Signed)
 DMT: Briumvi 450mg  IV q 24 weeks  Last infusion date: 10/03/2024  Next infusion date: 03/20/2025  Last Office Visit: 06/22/2024  Last labs: 03/09/2024  & other labs in system 11/2024   Dr Vear, I do not see where we personally received a clearance form from his surgeon.

## 2025-01-16 ENCOUNTER — Ambulatory Visit: Admitting: Family Medicine

## 2025-01-18 ENCOUNTER — Encounter: Payer: Self-pay | Admitting: Family Medicine

## 2025-01-18 ENCOUNTER — Ambulatory Visit: Admitting: Family Medicine

## 2025-01-18 VITALS — BP 142/82 | HR 93 | Ht 67.0 in | Wt 231.0 lb

## 2025-01-18 DIAGNOSIS — J320 Chronic maxillary sinusitis: Secondary | ICD-10-CM

## 2025-01-18 DIAGNOSIS — I10 Essential (primary) hypertension: Secondary | ICD-10-CM

## 2025-01-18 DIAGNOSIS — M25812 Other specified joint disorders, left shoulder: Secondary | ICD-10-CM

## 2025-01-18 DIAGNOSIS — M4727 Other spondylosis with radiculopathy, lumbosacral region: Secondary | ICD-10-CM | POA: Diagnosis not present

## 2025-01-18 DIAGNOSIS — M25811 Other specified joint disorders, right shoulder: Secondary | ICD-10-CM

## 2025-01-18 DIAGNOSIS — M4722 Other spondylosis with radiculopathy, cervical region: Secondary | ICD-10-CM | POA: Diagnosis not present

## 2025-01-18 DIAGNOSIS — M7522 Bicipital tendinitis, left shoulder: Secondary | ICD-10-CM

## 2025-01-18 MED ORDER — DICLOFENAC SODIUM 50 MG PO TBEC: 50.0000 mg | DELAYED_RELEASE_TABLET | Freq: Two times a day (BID) | ORAL | 0 refills | Status: AC | PRN

## 2025-01-18 MED ORDER — LISINOPRIL 5 MG PO TABS: 5.0000 mg | ORAL_TABLET | Freq: Every day | ORAL | 3 refills | Status: AC

## 2025-01-18 MED ORDER — MOMETASONE FUROATE 50 MCG/ACT NA SUSP: NASAL | 0 refills | Status: AC

## 2025-01-19 DIAGNOSIS — J320 Chronic maxillary sinusitis: Secondary | ICD-10-CM | POA: Insufficient documentation

## 2025-01-19 DIAGNOSIS — I10 Essential (primary) hypertension: Secondary | ICD-10-CM | POA: Insufficient documentation

## 2025-01-19 DIAGNOSIS — M7522 Bicipital tendinitis, left shoulder: Secondary | ICD-10-CM | POA: Insufficient documentation

## 2025-01-19 DIAGNOSIS — M25812 Other specified joint disorders, left shoulder: Secondary | ICD-10-CM | POA: Insufficient documentation

## 2025-01-19 NOTE — Progress Notes (Signed)
 "    Primary Care / Sports Medicine Office Visit  Patient Information:  Patient ID: Sean Freeman, male DOB: 1986-09-18 Age: 39 y.o. MRN: [id number]   Sean Freeman is a pleasant 39 y.o. male presenting with the following:  Chief Complaint  Patient presents with   Shoulder Freeman    Bil shoulder Freeman x 1 month. L>R. Patient has pinching sensation when his arms are at rest by his side and lifting can be difficult due to Freeman. Lying down is a aggravating factor. NKI.     Vitals:   01/18/25 1043  BP: (!) 142/82  Pulse: 93  SpO2: 98%   Vitals:   01/18/25 1043  Weight: 231 lb (104.8 kg)  Height: 5' 7 (1.702 m)   Body mass index is 36.18 kg/m.  No results found.   Independent interpretation of notes and tests performed by another provider:   None  Procedures performed:   None  Pertinent History, Exam, Impression, and Recommendations:   Discussed the use of AI scribe software for clinical note transcription with the patient, who gave verbal consent to proceed.  History of Present Illness   Sean Freeman is a 39 year old male with chronic neck and low back Freeman who presents with worsening left shoulder Freeman and stiffness.  Left Shoulder Freeman and Stiffness: - Onset approximately one month ago with progressive worsening - Most severe in the morning with marked tightness and limited mobility - Symptoms improve as the day progresses - Freeman is focal, described as a 'knot' in the region of the biceps tendon - Exacerbated by reaching across the body or lifting objects - Requires supporting the arm at times to facilitate movement due to stiffness - Ibuprofen initially provided relief but now only partially alleviates symptoms - Distinguishes current Freeman and swelling from a pre-existing cyst in the same region  Right Shoulder Stiffness and Discomfort: - Recent onset of mild stiffness and discomfort, suspected to be due to compensatory overuse - No significant Freeman or  swelling - Able to perform most activities without limitation - Prior similar episode 8-10 years ago, resolved after injection and physical therapy  Chronic Neck and Low Back Freeman: - Chronic neck and low back Freeman with prior cervical spine MRI showing abnormalities - Increased neck tightness and soreness, possibly related to sleep position and compensatory mechanisms due to shoulder Freeman - Awaiting referral to spine specialist and physical therapy  Medication and Therapy History: - Discontinued Celebrex  two to three months ago - Intermittent use of ibuprofen since discontinuing Celebrex  - Previous use of gabapentin  for nerve Freeman, not used for current episode - History of physical therapy including cold therapy modalities - Concern about adherence to a home exercise program      Physical Exam  VITALS: BP 142/82.  CARDIOVASCULAR: Regular rate and rhythm. Normal S1 and S2 without murmurs, rubs, or gallops.  LEFT SHOULDER INSPECTION: No deformity, swelling, erythema, or atrophy. PALPATION: Focal tenderness at the left bicipital groove. No crepitus, effusion, warmth, nodules, or bony abnormalities. RANGE OF MOTION: Active and passive range of motion preserved with discomfort at end-range elevation. STRENGTH: Isolated supraspinatus testing 5/5 strength with Freeman. External and internal rotation 5/5 strength. NEUROLOGICAL: Sensation intact to light touch in the left upper extremity; no focal motor deficit. SPECIAL TESTS: Positive empty can test on the left. Positive Neers test on the left. Negative Hawkins, Speeds, Floyd, and OBriens tests on the left.  RIGHT SHOULDER INSPECTION: No deformity, swelling, erythema, or  atrophy. PALPATION: Non-tender at the bicipital groove and subacromial space. RANGE OF MOTION: Full active and passive range of motion without Freeman. STRENGTH: Rotator cuff strength 5/5 throughout. SPECIAL TESTS: Negative empty can, Neers, Hawkins, and OBriens  tests.  Assessment and Plan    Left shoulder impingement syndrome with biceps tendinitis Acute-on-chronic left shoulder Freeman and stiffness consistent with impingement syndrome and biceps tendinitis. Inflammation and swelling are primary contributors. Symptoms impact daily activities. Prior right-sided symptoms resolved with injection and physical therapy. Risks of anti-inflammatory therapy include gastrointestinal upset; corticosteroid injection reserved for refractory symptoms, with anticipated benefit in reducing inflammation and Freeman. - Prescribed diclofenac  twice daily with food for two weeks. - Instructed him to discontinue other NSAIDs while on diclofenac . - Scheduled follow-up in two weeks with procedure slot reserved for possible corticosteroid injection if symptoms persist. - Placed referral to physical therapy for structured rehabilitation. - Advised him to notify if symptoms are only partially improved or if reliant on medication for Freeman control at two weeks, in which case corticosteroid injection will be considered. - Provided education on impingement syndrome and biceps tendinitis, including the role of inflammation and swelling.  Right shoulder impingement syndrome Mild right shoulder symptoms, likely early impingement syndrome. Right shoulder may be compensating for left-sided dysfunction. Preventive rehabilitation indicated to avoid progression.  Hypertension Chronic hypertension with persistently elevated blood pressure readings. Freeman may contribute, but hypertension requires pharmacologic intervention. - Prescribed lisinopril  5 mg once daily, preferably in the morning. - Instructed him not to self-monitor blood pressure at home to avoid unnecessary anxiety. - Planned to reassess blood pressure at the two-week follow-up or at a more convenient time if shoulder symptoms resolve and visit is rescheduled.  Chronic maxillary sinusitis Chronic maxillary sinusitis with persistent  nasal congestion, sinus pressure, and intermittent purulent and bloody nasal discharge. MRI from March of previous year showed minimal chronic inflammatory changes. ENT evaluation indicated for further management. - Placed referral to ENT [facility] in [place]) for evaluation and management. - Prescribed mometasone  nasal steroid spray, to be used twice daily until ENT evaluation. - Instructed him to inform ENT of prior MRI findings and to continue prescribed nasal steroid regardless of symptomatic improvement.  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 Prior MRI showed cervical pathology. - Placed referral to [facility] in [facility] for evaluation and management. - Provided information to expedite scheduling with the spine specialist.        Assessment & Plan Hypertension, unspecified type  Orders:   lisinopril  (ZESTRIL ) 5 MG tablet; Take 1 tablet (5 mg total) by mouth daily.  Impingement of left shoulder  Orders:   diclofenac  (VOLTAREN ) 50 MG EC tablet; Take 1 tablet (50 mg total) by mouth 2 (two) times daily as needed.  Biceps tendinitis of left shoulder  Orders:   diclofenac  (VOLTAREN ) 50 MG EC tablet; Take 1 tablet (50 mg total) by mouth 2 (two) times daily as needed.  Impingement of right shoulder  Orders:   diclofenac  (VOLTAREN ) 50 MG EC tablet; Take 1 tablet (50 mg total) by mouth 2 (two) times daily as needed.  Other spondylosis with radiculopathy, cervical region  Orders:   diclofenac  (VOLTAREN ) 50 MG EC tablet; Take 1 tablet (50 mg total) by mouth 2 (two) times daily as needed.  Chronic maxillary sinusitis  Orders:   mometasone  (NASONEX ) 50 MCG/ACT nasal spray; One  spray in each nostril twice a day, use left hand for right nostril, and right hand for left nostril.  Please dispense one bottle.   Ambulatory  referral to ENT  Lumbosacral spondylosis with radiculopathy  Orders:   diclofenac  (VOLTAREN ) 50 MG EC tablet; Take 1 tablet (50 mg total) by mouth 2 (two) times daily as needed.   No follow-ups on file.     Sean JINNY Ku, MD, [facility]   Primary Care Sports Medicine Primary Care and Sports Medicine at [facility]   "

## 2025-01-19 NOTE — Patient Instructions (Signed)
" °  VISIT SUMMARY: During your visit, we addressed your worsening left shoulder pain and stiffness, mild right shoulder discomfort, chronic neck and low back pain, hypertension, and chronic maxillary sinusitis. We discussed treatment options and made several referrals for further evaluation and management.  YOUR PLAN: LEFT SHOULDER IMPINGEMENT SYNDROME WITH BICEPS TENDINITIS: You have acute-on-chronic left shoulder pain and stiffness consistent with impingement syndrome and biceps tendinitis, which is causing inflammation and swelling. -Take diclofenac  twice daily with food for two weeks. -Discontinue other NSAIDs while on diclofenac . -Follow up in two weeks with a possible corticosteroid injection if symptoms persist. -Referral to physical therapy for structured rehabilitation. -Notify us  if symptoms are only partially improved or if you rely on medication for pain control at two weeks. -We provided education on impingement syndrome and biceps tendinitis, including the role of inflammation and swelling.  RIGHT SHOULDER IMPINGEMENT SYNDROME: You have mild right shoulder symptoms, likely early impingement syndrome, possibly due to compensating for your left shoulder. -Preventive rehabilitation to avoid progression.  HYPERTENSION: You have chronic hypertension with persistently elevated blood pressure readings. -Take lisinopril  5 mg once daily, preferably in the morning. -Do not self-monitor blood pressure at home to avoid unnecessary anxiety. -We will reassess your blood pressure at the two-week follow-up or at a more convenient time if shoulder symptoms resolve and the visit is rescheduled.  CHRONIC MAXILLARY SINUSITIS: You have chronic maxillary sinusitis with persistent nasal congestion, sinus pressure, and intermittent purulent and bloody nasal discharge. -Referral to ENT [facility] in [place]) for evaluation and management. -Use mometasone  nasal steroid spray twice daily until ENT  evaluation. -Inform ENT of prior MRI findings and continue prescribed nasal steroid regardless of symptomatic improvement.  CHRONIC NECK AND LOW BACK PAIN: You have chronic neck and low back pain with recent exacerbation, possibly related to altered sleep posture and compensatory mechanisms due to shoulder pain. -Referral to [facility] in [facility] for evaluation and management. -We provided information to expedite scheduling with the spine specialist.    Contains text generated by [employer].   "

## 2025-01-19 NOTE — Assessment & Plan Note (Signed)
" °  Orders:   mometasone  (NASONEX ) 50 MCG/ACT nasal spray; One spray in each nostril twice a day, use left hand for right nostril, and right hand for left nostril.  Please dispense one bottle.   Ambulatory referral to ENT  "

## 2025-01-19 NOTE — Assessment & Plan Note (Signed)
" °  Orders:   diclofenac  (VOLTAREN ) 50 MG EC tablet; Take 1 tablet (50 mg total) by mouth 2 (two) times daily as needed.  "

## 2025-01-19 NOTE — Assessment & Plan Note (Signed)
" °  Orders:   lisinopril  (ZESTRIL ) 5 MG tablet; Take 1 tablet (5 mg total) by mouth daily.  "

## 2025-01-23 ENCOUNTER — Telehealth: Payer: Self-pay | Admitting: Neurology

## 2025-01-23 ENCOUNTER — Ambulatory Visit: Admitting: Neurology

## 2025-01-23 NOTE — Telephone Encounter (Signed)
Office closed due to weather 

## 2025-01-26 NOTE — Telephone Encounter (Signed)
 Patient reschedule appointment due to bad weather on 01/27/25 at 10:00 am

## 2025-01-27 ENCOUNTER — Ambulatory Visit: Admitting: Neurology

## 2025-01-27 ENCOUNTER — Encounter: Payer: Self-pay | Admitting: Neurology

## 2025-01-27 VITALS — BP 139/86 | HR 115 | Ht 67.0 in | Wt 233.2 lb

## 2025-01-27 DIAGNOSIS — F418 Other specified anxiety disorders: Secondary | ICD-10-CM | POA: Insufficient documentation

## 2025-01-27 DIAGNOSIS — G35A Relapsing-remitting multiple sclerosis: Secondary | ICD-10-CM

## 2025-01-27 DIAGNOSIS — Z5181 Encounter for therapeutic drug level monitoring: Secondary | ICD-10-CM | POA: Insufficient documentation

## 2025-01-27 DIAGNOSIS — Z79899 Other long term (current) drug therapy: Secondary | ICD-10-CM | POA: Insufficient documentation

## 2025-01-27 DIAGNOSIS — M5416 Radiculopathy, lumbar region: Secondary | ICD-10-CM

## 2025-01-27 DIAGNOSIS — R200 Anesthesia of skin: Secondary | ICD-10-CM | POA: Insufficient documentation

## 2025-01-27 NOTE — Progress Notes (Signed)
 [place]  [facility]  PATIENT: Sean Freeman DOB: 11/05/1986  REFERRING DOCTOR OR PCP: Selinda Ku, MD SOURCE: Patient, notes from [facility], imaging and lab reports, MRI images personally reviewed.  _________________________________   HISTORICAL  CHIEF COMPLAINT:  Chief Complaint  Patient presents with   Follow-up    Pt in room 10. Alone. Here for MS follow up.    HISTORY OF PRESENT ILLNESS:  Kaiea Esselman is a 39 y.o. man with relapsing remitting MS  UPDATE 01/28/2024: He started Briumvi with split infusion May 2025.  Next infusion April 2026.  He tolerated the infusion well but BP was elevated .  He has since seen PCP and a medication was initiated.   Balance is fine.   Gait is fine.  He goes downstairs without using the bannister.  Right foot drop improved.   He still has hand numbness and right leg numbness.   Hand numbness is sometimes painful.   He notes it more after he walks more.   He has a Lhermitte sign at times.   Symptoms are worse in heat and it makes him feel like he just had a workout.  Gabapentin  has helped dysesthesias some.   He takes baclofen  20 mg bid.  He notes mild urinary changes and has reduced stream and some urgency.    He denies nocturia more tan once.       Vision is doing well.  No diplopia.   Eyes sometimes are blurry in the morning but better back to baseline after a few minutes.  He denies changes in cognition .   He sleeps well for the most part though numbness sometimes awakens him.    He notes some fatigue but similar to last year.   He is noting more anxiety with his MS diagnosis and worries some about his future.    He tried sertraline  but felt poorly and stopped.   He feels the passage of more time has helped his anxiety as MS is stable.  He is otherwise fairly healthy and stays active.  He was placed on Celebrex  and baclofen  for his neck pain.  He has mild hyperlipidemia.  He has sinusitis and sometimes feels dizzy if he  stands up quick.    He is taking vit D and increased from 4000 to 5000 U.    He is having more LBP and it sometimes radiates down the leg.   He had seen a chiropractor and adjustments help a while.    He is also on diclofenac   MS HISTORY He had the onset of numbness in his hands and neck pain in  mid 2024.  He saw Dr. Burnetta at [facility] and an MRI of the cervical spine showed mild to moderate multilevel spinal stenosis but also showed multiple T2 hyperintense foci concerning for demyelination.  He notes the numbness in the left chest one morning in mid July 2024.   He also noted sensation was a little different in the back.    Over a short period of time, he notes bilateral sensory symptoms and altered sensation in his feet.   He notes the dysesthesias increase with certain positions, especially at night.   He gets an electric jolt when he flexes his neck that goes all the way down to his legs.   He denies weakness.   However, he feels grip is altered due to reduced sensation.   The right foot will sometimes rag a bit.     He had  leg numbness onset December 2024    Imaging  MRI of the cervical spine 08/2023 showed T2 hyperintense foci posterolaterally to the left adjacent to C2, posteriorly adjacent to C3, generally adjacent to C4, Posteriorly adjacent to C4-C5, posterolaterally to the right adjacent to upper C5, posterolaterally to the left adjacent to T1.  Contrast was not given with this MRI.  Additionally, there are degenerative changes that are maximal at C5-C6.  There is mild spinal stenosis due to a right paramedian disc protrusion that also causes moderate right foraminal narrowing.  Brain MRI 03/09/2024 showed Scattered T2/FLAIR hyperintense foci in the cerebral hemispheres and left pons in a pattern consistent with demyelinating plaque associated with multiple sclerosis. 5 of the foci in the cerebral hemispheres enhance after the administration of contrast material consistent with  acute demyelination. The combination of acute and chronic foci in the brain and the foci noted in the cervical spinal cord meets McDonald criteria for multiple sclerosis.     REVIEW OF SYSTEMS: Constitutional: No fevers, chills, sweats, or change in appetite Eyes: No visual changes, double vision, eye pain Ear, nose and throat: No hearing loss, ear pain, nasal congestion, sore throat Cardiovascular: No chest pain, palpitations Respiratory:  No shortness of breath at rest or with exertion.   No wheezes GastrointestinaI: No nausea, vomiting, diarrhea, abdominal pain, fecal incontinence Genitourinary:  No dysuria, urinary retention or frequency.  No nocturia. Musculoskeletal:  No neck pain, back pain Integumentary: No rash, pruritus, skin lesions Neurological: as above Psychiatric: No depression at this time.  No anxiety Endocrine: No palpitations, diaphoresis, change in appetite, change in weigh or increased thirst Hematologic/Lymphatic:  No anemia, purpura, petechiae. Allergic/Immunologic: No itchy/runny eyes, nasal congestion, recent allergic reactions, rashes  ALLERGIES: Allergies  Allergen Reactions   Amoxicillin Hives   Penicillins Hives   Rosuvastatin  Rash and Dermatitis    HOME MEDICATIONS:  Current Outpatient Medications:    atorvastatin  (LIPITOR) 20 MG tablet, Take 1 tablet (20 mg total) by mouth daily., Disp: 90 tablet, Rfl: 3   baclofen  (LIORESAL ) 20 MG tablet, TAKE 1 TABLET (20 MG TOTAL) BY MOUTH 3 (THREE) TIMES DAILY AS NEEDED FOR MUSCLE SPASMS., Disp: 270 tablet, Rfl: 1   Cholecalciferol (VITAMIN D3) 50 MCG (2000 UT) capsule, Take 2,000 Units by mouth 2 (two) times daily., Disp: , Rfl:    diclofenac  (VOLTAREN ) 50 MG EC tablet, Take 1 tablet (50 mg total) by mouth 2 (two) times daily as needed., Disp: 60 tablet, Rfl: 0   gabapentin  (NEURONTIN ) 300 MG capsule, Take 1 capsule (300 mg total) by mouth 3 (three) times daily., Disp: 90 capsule, Rfl: 5   mometasone  (NASONEX )  50 MCG/ACT nasal spray, One spray in each nostril twice a day, use left hand for right nostril, and right hand for left nostril.  Please dispense one bottle., Disp: 1 g, Rfl: 0   lisinopril  (ZESTRIL ) 5 MG tablet, Take 1 tablet (5 mg total) by mouth daily. (Patient not taking: Reported on 01/27/2025), Disp: 90 tablet, Rfl: 3   traZODone  (DESYREL ) 50 MG tablet, TAKE 0.5-1 TABLETS BY MOUTH AT BEDTIME AS NEEDED FOR SLEEP. (Patient not taking: Reported on 01/27/2025), Disp: 90 tablet, Rfl: 1  PAST MEDICAL HISTORY: Past Medical History:  Diagnosis Date   Allergy    Dysplastic nevus 02/29/2024   Left spinal upper back. Severe atypia, with focal scar, margin close. Excised 05/04/24, margins free   GERD (gastroesophageal reflux disease)    Hyperlipidemia    MS (multiple sclerosis) 03/09/2024  PAST SURGICAL HISTORY: Past Surgical History:  Procedure Laterality Date   TYMPANOSTOMY TUBE PLACEMENT Bilateral     FAMILY HISTORY: Family History  Problem Relation Age of Onset   Diabetes Mother    Allergies Brother    Diabetes Paternal Uncle    Heart attack Paternal Uncle    Diabetes Paternal Uncle    Heart attack Paternal Uncle    Diabetes Paternal Uncle    Heart attack Paternal Uncle    Diabetes Maternal Grandmother    Heart attack Maternal Grandmother    Diabetes Maternal Grandfather    Heart attack Maternal Grandfather     SOCIAL HISTORY: Social History   Socioeconomic History   Marital status: Significant Other    Spouse name: heather   Number of children: 2   Years of education: 12   Highest education level: High school graduate  Occupational History   Not on file  Tobacco Use   Smoking status: Never    Passive exposure: Never   Smokeless tobacco: Never  Vaping Use   Vaping status: Never Used  Substance and Sexual Activity   Alcohol use: Not Currently    Comment: social   Drug use: Never   Sexual activity: Yes    Birth control/protection: Condom  Other Topics Concern    Not on file  Social History Narrative   Not on file   Social Drivers of Health   Tobacco Use: Low Risk (01/27/2025)   Patient History    Smoking Tobacco Use: Never    Smokeless Tobacco Use: Never    Passive Exposure: Never  Financial Resource Strain: Not on file  Food Insecurity: Not on file  Transportation Needs: Not on file  Physical Activity: Not on file  Stress: Not on file  Social Connections: Not on file  Intimate Partner Violence: Not on file  Depression (PHQ2-9): Low Risk (01/18/2025)   Depression (PHQ2-9)    PHQ-2 Score: 0  Alcohol Screen: Low Risk (03/14/2022)   Alcohol Screen    Last Alcohol Screening Score (AUDIT): 3  Housing: Not on file  Utilities: Not on file  Health Literacy: Not on file       PHYSICAL EXAM  Vitals:   01/27/25 0956  BP: 139/86  Pulse: (!) 115  Weight: 233 lb 3.2 oz (105.8 kg)  Height: 5' 7 (1.702 m)    Body mass index is 36.52 kg/m.   General: The patient is well-developed and well-nourished and in no acute distress  HEENT:  Head is [place]/AT.  Sclera are anicteric.    Neck:  The neck is nontender.   Skin/Extremities: Extremities are without rash or  edema.   Excision on left back (dysplastic nevus; pre-cancerous).  Gangion cyst right wrist  Neurologic Exam  Mental status: The patient is alert and oriented x 3 at the time of the examination. The patient has apparent normal recent and remote memory, with an apparently normal attention span and concentration ability.   Speech is normal.  Cranial nerves: Extraocular movements are full. Pupils are equal, round, and reactive to light and accomodation.   . There is good facial sensation to soft touch bilaterally.Facial strength is normal.  Trapezius and sternocleidomastoid strength is normal. No dysarthria is noted.  . No obvious hearing deficits are noted.  Motor:  Muscle bulk is normal.   Tone is normal. Strength is  5 / 5 in all 4 extremities.   Sensory: Sensory testing is intact to  pinprick, soft touch and vibration sensation in the  arms and legs now  Coordination: Cerebellar testing reveals good finger-nose-finger and heel-to-shin bilaterally.  Gait and station: Station is normal.   Gait and tandem gait are normal.. Romberg is negative.   Reflexes: Deep tendon reflexes are symmetric and normal (1 in arms, 2 at knees and ankles).        DIAGNOSTIC DATA (LABS, IMAGING, TESTING) - I reviewed patient records, labs, notes, testing and imaging myself where available.  Lab Results  Component Value Date   WBC 5.4 11/21/2024   HGB 15.8 11/21/2024   HCT 47.4 11/21/2024   MCV 85 11/21/2024   PLT 247 11/21/2024      Component Value Date/Time   NA 141 11/21/2024 0939   K 4.6 11/21/2024 0939   CL 104 11/21/2024 0939   CO2 23 11/21/2024 0939   GLUCOSE 103 (H) 11/21/2024 0939   BUN 14 11/21/2024 0939   CREATININE 1.20 11/21/2024 0939   CALCIUM  10.2 11/21/2024 0939   PROT 7.0 11/21/2024 0939   ALBUMIN 4.6 11/21/2024 0939   AST 29 11/21/2024 0939   ALT 46 (H) 11/21/2024 0939   ALKPHOS 123 11/21/2024 0939   BILITOT 0.5 11/21/2024 0939   Lab Results  Component Value Date   CHOL 159 11/21/2024   HDL 37 (L) 11/21/2024   LDLCALC 84 11/21/2024   TRIG 227 (H) 11/21/2024   CHOLHDL 4.3 11/21/2024   Lab Results  Component Value Date   HGBA1C 5.3 11/21/2024   No results found for: VITAMINB12 Lab Results  Component Value Date   TSH 2.400 11/23/2023       ASSESSMENT AND PLAN  Multiple sclerosis, relapsing-remitting  Encounter for monitoring immunomodulating therapy  High risk medication use  Numbness  Depression with anxiety  For his relapsing remitting MS, we will continue Briumvi.  Will check labs.   Need to check MRI brain and cerv/thoracic spine to assess for breakthrough activity.  If occurring may switch DMT>   at or around time of next visit Stay active and exercise Continue vit D  Due to worsening LBP/radiculopathy will check MRI lumbar  spine - right radiculopathy in setting of immunosuppressant Mood is doing better.  He stopped the sertraline . Rtc 6 months or sooner if new or worsening symptoms.       Kirandeep Fariss A. Vear, MD, [facility] 01/27/2025, 10:34 AM Certified in Neurology, Clinical Neurophysiology, Sleep Medicine and Neuroimaging  [facility] [address], [address] [place], [place] [address] [phone]

## 2025-01-30 ENCOUNTER — Ambulatory Visit: Admitting: Family Medicine

## 2025-02-07 ENCOUNTER — Ambulatory Visit: Admitting: Family Medicine

## 2025-05-22 ENCOUNTER — Ambulatory Visit: Admitting: Dermatology

## 2025-11-20 ENCOUNTER — Encounter: Admitting: Family Medicine

## 2026-02-26 ENCOUNTER — Encounter: Admitting: Dermatology
